# Patient Record
Sex: Female | Born: 1960 | Race: White | Hispanic: No | Marital: Married | Smoking: Never smoker
Health system: Southern US, Community
[De-identification: ages and names within clinical notes are randomized; demographics above are authoritative.]

## PROBLEM LIST (undated history)

## (undated) DIAGNOSIS — K219 Gastro-esophageal reflux disease without esophagitis: Secondary | ICD-10-CM

## (undated) DIAGNOSIS — N200 Calculus of kidney: Secondary | ICD-10-CM

## (undated) DIAGNOSIS — E785 Hyperlipidemia, unspecified: Secondary | ICD-10-CM

## (undated) DIAGNOSIS — H9190 Unspecified hearing loss, unspecified ear: Secondary | ICD-10-CM

## (undated) DIAGNOSIS — K589 Irritable bowel syndrome without diarrhea: Secondary | ICD-10-CM

## (undated) DIAGNOSIS — I10 Essential (primary) hypertension: Secondary | ICD-10-CM

## (undated) DIAGNOSIS — IMO0001 Reserved for inherently not codable concepts without codable children: Secondary | ICD-10-CM

## (undated) HISTORY — PX: CHOLECYSTECTOMY: SHX55

## (undated) HISTORY — PX: ABDOMINAL HYSTERECTOMY: SHX81

## (undated) HISTORY — PX: COCHLEAR IMPLANT: SUR684

---

## 2011-12-31 DIAGNOSIS — G43909 Migraine, unspecified, not intractable, without status migrainosus: Secondary | ICD-10-CM | POA: Insufficient documentation

## 2012-08-01 DIAGNOSIS — K589 Irritable bowel syndrome without diarrhea: Secondary | ICD-10-CM | POA: Insufficient documentation

## 2012-08-01 DIAGNOSIS — K219 Gastro-esophageal reflux disease without esophagitis: Secondary | ICD-10-CM | POA: Insufficient documentation

## 2013-02-01 DIAGNOSIS — I10 Essential (primary) hypertension: Secondary | ICD-10-CM | POA: Insufficient documentation

## 2013-02-07 ENCOUNTER — Encounter (HOSPITAL_BASED_OUTPATIENT_CLINIC_OR_DEPARTMENT_OTHER): Payer: Self-pay | Admitting: Emergency Medicine

## 2013-02-07 ENCOUNTER — Emergency Department (HOSPITAL_BASED_OUTPATIENT_CLINIC_OR_DEPARTMENT_OTHER): Payer: TRICARE For Life (TFL)

## 2013-02-07 ENCOUNTER — Emergency Department (HOSPITAL_BASED_OUTPATIENT_CLINIC_OR_DEPARTMENT_OTHER)
Admission: EM | Admit: 2013-02-07 | Discharge: 2013-02-07 | Disposition: A | Discharge status: Home / Self Care | Payer: TRICARE For Life (TFL) | Attending: Emergency Medicine | Admitting: Emergency Medicine

## 2013-02-07 DIAGNOSIS — Z8669 Personal history of other diseases of the nervous system and sense organs: Secondary | ICD-10-CM | POA: Insufficient documentation

## 2013-02-07 DIAGNOSIS — K219 Gastro-esophageal reflux disease without esophagitis: Secondary | ICD-10-CM | POA: Insufficient documentation

## 2013-02-07 DIAGNOSIS — R42 Dizziness and giddiness: Secondary | ICD-10-CM | POA: Insufficient documentation

## 2013-02-07 DIAGNOSIS — Z79899 Other long term (current) drug therapy: Secondary | ICD-10-CM | POA: Insufficient documentation

## 2013-02-07 DIAGNOSIS — R110 Nausea: Secondary | ICD-10-CM | POA: Insufficient documentation

## 2013-02-07 DIAGNOSIS — N200 Calculus of kidney: Secondary | ICD-10-CM

## 2013-02-07 HISTORY — DX: Gastro-esophageal reflux disease without esophagitis: K21.9

## 2013-02-07 HISTORY — DX: Reserved for inherently not codable concepts without codable children: IMO0001

## 2013-02-07 HISTORY — DX: Unspecified hearing loss, unspecified ear: H91.90

## 2013-02-07 HISTORY — DX: Calculus of kidney: N20.0

## 2013-02-07 LAB — URINE MICROSCOPIC-ADD ON

## 2013-02-07 LAB — URINALYSIS, ROUTINE W REFLEX MICROSCOPIC
Nitrite: NEGATIVE
Protein, ur: 30 mg/dL — AB
Specific Gravity, Urine: 1.025 (ref 1.005–1.030)
Urobilinogen, UA: 1 mg/dL (ref 0.0–1.0)

## 2013-02-07 LAB — BASIC METABOLIC PANEL
CO2: 29 mEq/L (ref 19–32)
Calcium: 9.7 mg/dL (ref 8.4–10.5)
GFR calc Af Amer: >90 mL/min (ref >90)
GFR calc non Af Amer: 82 mL/min — ABNORMAL LOW (ref >90)
Glucose, Bld: 106 mg/dL — ABNORMAL HIGH (ref 70–99)
Potassium: 3.9 mEq/L (ref 3.5–5.1)
Sodium: 139 mEq/L (ref 135–145)

## 2013-02-07 MED ORDER — OXYCODONE-ACETAMINOPHEN 5-325 MG PO TABS: 2.0000 | ORAL_TABLET | ORAL | Status: DC | PRN

## 2013-02-07 MED ORDER — ONDANSETRON HCL 4 MG/2ML IJ SOLN
4.0000 mg | Freq: Once | INTRAMUSCULAR | Status: AC
  Administered 2013-02-07: 4 mg via INTRAVENOUS

## 2013-02-07 MED ORDER — MORPHINE SULFATE 4 MG/ML IJ SOLN
4.0000 mg | Freq: Once | INTRAMUSCULAR | Status: AC
  Administered 2013-02-07: 4 mg via INTRAVENOUS
  Filled 2013-02-07: qty 1

## 2013-02-07 MED ORDER — TAMSULOSIN HCL 0.4 MG PO CAPS: 0.4000 mg | ORAL_CAPSULE | Freq: Every day | ORAL | Status: DC

## 2013-02-07 MED ORDER — ONDANSETRON 8 MG PO TBDP: ORAL_TABLET | ORAL | Status: DC

## 2013-02-07 MED ORDER — KETOROLAC TROMETHAMINE 30 MG/ML IJ SOLN
30.0000 mg | Freq: Once | INTRAMUSCULAR | Status: AC
  Administered 2013-02-07: 30 mg via INTRAVENOUS
  Filled 2013-02-07: qty 1

## 2013-02-07 NOTE — ED Notes (Signed)
Patient asked to change into gown. 

## 2013-02-07 NOTE — ED Notes (Signed)
Pt reports left flank pain that started 1 hour ago.  She has a known renal stone.  States she is able to void.

## 2013-02-07 NOTE — ED Notes (Signed)
Family at bedside. 

## 2013-02-07 NOTE — ED Notes (Signed)
States she was dx with kidney stone recently-left flank pain x 1 hour

## 2013-02-07 NOTE — ED Provider Notes (Signed)
CSN: [id number]     Arrival date & time 02/07/13  1325 History   First MD Initiated Contact with Patient 02/07/13 1336     Chief Complaint  Patient presents with  . Flank Pain   (Consider location/radiation/quality/duration/timing/severity/associated sxs/prior Treatment) HPI Comments: Patient presents with left flank pain. She states she was diagnosed earlier this summer with a stone in her kidney but about an hour ago started having sudden onset of pain in her left flank. It radiates around to her left midabdomen. She's never had pain like this before. She has some associated nausea and lightheadedness. She denies any urinary symptoms. She denies any fevers or chills. The kidney stone was picked up as an incidental finding on a CT scan of her abdomen for unrelated reasons.  Patient is a 52 y.o. female presenting with flank pain.  Flank Pain Pertinent negatives include no chest pain, no abdominal pain, no headaches and no shortness of breath.    Past Medical History  Diagnosis Date  . Kidney stone   . Hearing impaired   . GERD (gastroesophageal reflux disease)    Past Surgical History  Procedure Laterality Date  . Abdominal hysterectomy     No family history on file. History  Substance Use Topics  . Smoking status: Never Smoker   . Smokeless tobacco: Not on file  . Alcohol Use: No   OB History   Grav Para Term Preterm Abortions TAB SAB Ect Mult Living                 Review of Systems  Constitutional: Negative for fever, chills, diaphoresis and fatigue.  HENT: Negative for congestion, rhinorrhea and sneezing.   Eyes: Negative.   Respiratory: Negative for cough, chest tightness and shortness of breath.   Cardiovascular: Negative for chest pain and leg swelling.  Gastrointestinal: Positive for nausea. Negative for vomiting, abdominal pain, diarrhea and blood in stool.  Genitourinary: Positive for flank pain. Negative for frequency, hematuria and difficulty urinating.   Musculoskeletal: Positive for back pain. Negative for arthralgias.  Skin: Negative for rash.  Neurological: Negative for dizziness, speech difficulty, weakness, numbness and headaches.    Allergies  Aspirin  Home Medications   Current Outpatient Rx  Name  Route  Sig  Dispense  Refill  . OMEPRAZOLE PO   Oral   Take by mouth.         . ondansetron (ZOFRAN ODT) 8 MG disintegrating tablet      8mg  ODT q4 hours prn nausea   15 tablet   0   . oxyCODONE-acetaminophen (PERCOCET) 5-325 MG per tablet   Oral   Take 2 tablets by mouth every 4 (four) hours as needed.   20 tablet   0   . tamsulosin (FLOMAX) 0.4 MG CAPS capsule   Oral   Take 1 capsule (0.4 mg total) by mouth daily.   10 capsule   0    BP 138/80  Pulse 70  Temp(Src) 97.9 F (36.6 C) (Oral)  Resp 18  Ht 4\' 11"  (1.499 m)  Wt 95 lb (43.092 kg)  BMI 19.18 kg/m2  SpO2 100% Physical Exam  Constitutional: She is oriented to person, place, and time. She appears well-developed and well-nourished.  Patient appears uncomfortable  HENT:  Head: Normocephalic and atraumatic.  Eyes: Pupils are equal, round, and reactive to light.  Neck: Normal range of motion. Neck supple.  Cardiovascular: Normal rate, regular rhythm and normal heart sounds.   Pulmonary/Chest: Effort normal and breath sounds  normal. No respiratory distress. She has no wheezes. She has no rales. She exhibits no tenderness.  Abdominal: Soft. Bowel sounds are normal. There is tenderness (moderate tenderness to the left flank). There is no rebound and no guarding.  Musculoskeletal: Normal range of motion. She exhibits no edema.  Lymphadenopathy:    She has no cervical adenopathy.  Neurological: She is alert and oriented to person, place, and time.  Skin: Skin is warm and dry. No rash noted.  Psychiatric: She has a normal mood and affect.    ED Course  Procedures (including critical care time) Labs Review Results for orders placed during the hospital  encounter of 02/07/13  URINALYSIS, ROUTINE W REFLEX MICROSCOPIC      Result Value Range   Color, Urine YELLOW  YELLOW   APPearance CLEAR  CLEAR   Specific Gravity, Urine 1.025  1.005 - 1.030   pH 6.0  5.0 - 8.0   Glucose, UA NEGATIVE  NEGATIVE mg/dL   Hgb urine dipstick MODERATE (*) NEGATIVE   Bilirubin Urine NEGATIVE  NEGATIVE   Ketones, ur NEGATIVE  NEGATIVE mg/dL   Protein, ur 30 (*) NEGATIVE mg/dL   Urobilinogen, UA 1.0  0.0 - 1.0 mg/dL   Nitrite NEGATIVE  NEGATIVE   Leukocytes, UA SMALL (*) NEGATIVE  BASIC METABOLIC PANEL      Result Value Range   Sodium 139  135 - 145 mEq/L   Potassium 3.9  3.5 - 5.1 mEq/L   Chloride 99  96 - 112 mEq/L   CO2 29  19 - 32 mEq/L   Glucose, Bld 106 (*) 70 - 99 mg/dL   BUN 17  6 - 23 mg/dL   Creatinine, Ser 3.24  0.50 - 1.10 mg/dL   Calcium 9.7  8.4 - 40.1 mg/dL   GFR calc non Af Amer 82 (*) >90 mL/min   GFR calc Af Amer >90  >90 mL/min  URINE MICROSCOPIC-ADD ON      Result Value Range   Squamous Epithelial / LPF RARE  RARE   WBC, UA 3-6  <3 WBC/hpf   RBC / HPF 3-6  <3 RBC/hpf   Bacteria, UA MANY (*) RARE   Crystals CA OXALATE CRYSTALS (*) NEGATIVE   Ct Abdomen Pelvis Wo Contrast  02/07/2013   CLINICAL DATA:  Left flank pain.  Post hysterectomy.  EXAM: CT ABDOMEN AND PELVIS WITHOUT CONTRAST  TECHNIQUE: Multidetector CT imaging of the abdomen and pelvis was performed following the standard protocol without intravenous contrast.  COMPARISON:  03/16/2012  FINDINGS: Lung bases are clear. No effusions. Heart is normal size.  Prior cholecystectomy. Liver, spleen, pancreas, adrenals and right kidney are unremarkable on this unenhanced study. There is mild left hydronephrosis and hydroureter due to a 5 mm stone at the pelvic brim within the mid left ureter. Multiple calcified phleboliths in the pelvis.  Prior hysterectomy. No adnexal masses. Urinary bladder is unremarkable. Bowel grossly unremarkable. No free fluid, free air, or adenopathy. Aorta is  normal caliber.  IMPRESSION: 5 mm mid left ureteral stone with mild left hydronephrosis.   Electronically Signed   By: Charlett Nose M.D.   On: 02/07/2013 14:38     Imaging Review Ct Abdomen Pelvis Wo Contrast  02/07/2013   CLINICAL DATA:  Left flank pain.  Post hysterectomy.  EXAM: CT ABDOMEN AND PELVIS WITHOUT CONTRAST  TECHNIQUE: Multidetector CT imaging of the abdomen and pelvis was performed following the standard protocol without intravenous contrast.  COMPARISON:  03/16/2012  FINDINGS: Lung bases  are clear. No effusions. Heart is normal size.  Prior cholecystectomy. Liver, spleen, pancreas, adrenals and right kidney are unremarkable on this unenhanced study. There is mild left hydronephrosis and hydroureter due to a 5 mm stone at the pelvic brim within the mid left ureter. Multiple calcified phleboliths in the pelvis.  Prior hysterectomy. No adnexal masses. Urinary bladder is unremarkable. Bowel grossly unremarkable. No free fluid, free air, or adenopathy. Aorta is normal caliber.  IMPRESSION: 5 mm mid left ureteral stone with mild left hydronephrosis.   Electronically Signed   By: Charlett Nose M.D.   On: 02/07/2013 14:38    EKG Interpretation   None       MDM   1. Kidney stone    Patient has pain controlled after morphine, Toradol and Zofran. Her urine does not appear to be infected. She has no evidence of renal failure. She was given prescriptions for Percocet, Zofran and Flomax. She was advised to followup with a urologist as soon as possible. She lives in [facility] and requested a followup with a Nurse, learning disability. I did advise her that our urologists on call are with [place] but I did give her the numbers of both practices for potential followup. She was discharged with copies of her imaging studies.    Rolan Bucco, MD 02/07/13 [phone]

## 2013-02-08 LAB — URINE CULTURE

## 2013-02-14 DIAGNOSIS — N201 Calculus of ureter: Secondary | ICD-10-CM | POA: Insufficient documentation

## 2013-03-07 DIAGNOSIS — E785 Hyperlipidemia, unspecified: Secondary | ICD-10-CM | POA: Insufficient documentation

## 2013-11-02 DIAGNOSIS — N200 Calculus of kidney: Secondary | ICD-10-CM | POA: Insufficient documentation

## 2014-02-21 DIAGNOSIS — M810 Age-related osteoporosis without current pathological fracture: Secondary | ICD-10-CM | POA: Insufficient documentation

## 2014-06-28 DIAGNOSIS — H903 Sensorineural hearing loss, bilateral: Secondary | ICD-10-CM | POA: Insufficient documentation

## 2016-01-19 ENCOUNTER — Emergency Department (HOSPITAL_BASED_OUTPATIENT_CLINIC_OR_DEPARTMENT_OTHER)
Admission: EM | Admit: 2016-01-19 | Discharge: 2016-01-19 | Disposition: A | Discharge status: Home / Self Care | Payer: TRICARE For Life (TFL) | Attending: Emergency Medicine | Admitting: Emergency Medicine

## 2016-01-19 ENCOUNTER — Encounter (HOSPITAL_BASED_OUTPATIENT_CLINIC_OR_DEPARTMENT_OTHER): Payer: Self-pay | Admitting: *Deleted

## 2016-01-19 DIAGNOSIS — Z79899 Other long term (current) drug therapy: Secondary | ICD-10-CM | POA: Insufficient documentation

## 2016-01-19 DIAGNOSIS — N3000 Acute cystitis without hematuria: Secondary | ICD-10-CM

## 2016-01-19 DIAGNOSIS — R300 Dysuria: Secondary | ICD-10-CM | POA: Diagnosis present

## 2016-01-19 LAB — URINE MICROSCOPIC-ADD ON

## 2016-01-19 LAB — URINALYSIS, ROUTINE W REFLEX MICROSCOPIC
Bilirubin Urine: NEGATIVE
Glucose, UA: NEGATIVE mg/dL
KETONES UR: NEGATIVE mg/dL
Nitrite: NEGATIVE
PH: 7 (ref 5.0–8.0)
Protein, ur: NEGATIVE mg/dL
SPECIFIC GRAVITY, URINE: 1.003 — AB (ref 1.005–1.030)

## 2016-01-19 MED ORDER — HYDROCODONE-ACETAMINOPHEN 5-325 MG PO TABS: 1.0000 | ORAL_TABLET | ORAL | 0 refills | Status: DC | PRN

## 2016-01-19 MED ORDER — HYDROCODONE-ACETAMINOPHEN 5-325 MG PO TABS
1.0000 | ORAL_TABLET | Freq: Once | ORAL | Status: AC
  Administered 2016-01-19: 1 via ORAL
  Filled 2016-01-19: qty 1

## 2016-01-19 MED ORDER — PHENAZOPYRIDINE HCL 100 MG PO TABS
200.0000 mg | ORAL_TABLET | Freq: Once | ORAL | Status: AC
  Administered 2016-01-19: 200 mg via ORAL
  Filled 2016-01-19: qty 2

## 2016-01-19 MED ORDER — SULFAMETHOXAZOLE-TRIMETHOPRIM 800-160 MG PO TABS
1.0000 | ORAL_TABLET | Freq: Once | ORAL | Status: AC
  Administered 2016-01-19: 1 via ORAL
  Filled 2016-01-19: qty 1

## 2016-01-19 MED ORDER — PHENAZOPYRIDINE HCL 200 MG PO TABS
200.0000 mg | ORAL_TABLET | Freq: Three times a day (TID) | ORAL | 0 refills | Status: DC
Start: 2016-01-19 — End: 2018-02-01

## 2016-01-19 MED ORDER — SULFAMETHOXAZOLE-TRIMETHOPRIM 800-160 MG PO TABS: 1.0000 | ORAL_TABLET | Freq: Two times a day (BID) | ORAL | 0 refills | Status: AC

## 2016-01-19 NOTE — ED Provider Notes (Signed)
[facility] Provider Note   CSN: [id number] Arrival date & time: 01/19/16  [id number]     History   Chief Complaint Chief Complaint  Patient presents with  . Dysuria    HPI Joan Horn is a 55 y.o. female.  Pt presents to the ED today with dysuria.  She said that it feels like an UTI.  She denies f/c.      Past Medical History:  Diagnosis Date  . GERD (gastroesophageal reflux disease)   . Hearing impaired   . Kidney stone     There are no active problems to display for this patient.   Past Surgical History:  Procedure Laterality Date  . ABDOMINAL HYSTERECTOMY      OB History    No data available       Home Medications    Prior to Admission medications   Medication Sig Start Date End Date Taking? Authorizing Provider  amLODipine (NORVASC) 2.5 MG tablet Take 2.5 mg by mouth daily.   Yes Historical Provider, MD  HYDROcodone-acetaminophen (NORCO/VICODIN) 5-325 MG tablet Take 1 tablet by mouth every 4 (four) hours as needed. 01/19/16   Jacalyn LefevreJulie Shanise Balch, MD  OMEPRAZOLE PO Take by mouth.    Historical Provider, MD  ondansetron (ZOFRAN ODT) 8 MG disintegrating tablet 8mg  ODT q4 hours prn nausea 02/07/13   Rolan BuccoMelanie Belfi, MD  phenazopyridine (PYRIDIUM) 200 MG tablet Take 1 tablet (200 mg total) by mouth 3 (three) times daily. 01/19/16   Jacalyn LefevreJulie Tikesha Mort, MD  sulfamethoxazole-trimethoprim (BACTRIM DS,SEPTRA DS) 800-160 MG tablet Take 1 tablet by mouth 2 (two) times daily. 01/19/16 01/26/16  Jacalyn LefevreJulie Dontrey Snellgrove, MD    Family History No family history on file.  Social History Social History  Substance Use Topics  . Smoking status: Never Smoker  . Smokeless tobacco: Not on file  . Alcohol use No     Allergies   Aspirin   Review of Systems Review of Systems  Genitourinary: Positive for dysuria and hematuria.  All other systems reviewed and are negative.    Physical Exam Updated Vital Signs BP 163/88   Pulse 82   Temp 97.7 F (36.5 C) (Oral)   Resp  16   Ht 4\' 11"  (1.499 m)   Wt 98 lb (44.5 kg)   SpO2 100%   BMI 19.79 kg/m   Physical Exam  Constitutional: She is oriented to person, place, and time. She appears well-developed and well-nourished.  HENT:  Head: Normocephalic and atraumatic.  Right Ear: External ear normal.  Left Ear: External ear normal.  Nose: Nose normal.  Mouth/Throat: Oropharynx is clear and moist.  Eyes: Conjunctivae and EOM are normal. Pupils are equal, round, and reactive to light.  Neck: Normal range of motion. Neck supple.  Cardiovascular: Normal rate, regular rhythm, normal heart sounds and intact distal pulses.   Pulmonary/Chest: Effort normal and breath sounds normal.  Abdominal: Soft. Bowel sounds are normal. There is tenderness in the suprapubic area.  Musculoskeletal: Normal range of motion.  Neurological: She is alert and oriented to person, place, and time.  Skin: Skin is warm.  Psychiatric: She has a normal mood and affect. Her behavior is normal. Judgment and thought content normal.  Nursing note and vitals reviewed.    ED Treatments / Results  Labs (all labs ordered are listed, but only abnormal results are displayed) Labs Reviewed  URINALYSIS, ROUTINE W REFLEX MICROSCOPIC (NOT AT [facility]) - Abnormal; Notable for the following:       Result Value  Specific Gravity, Urine 1.003 (*)    Hgb urine dipstick TRACE (*)    Leukocytes, UA SMALL (*)    All other components within normal limits  URINE MICROSCOPIC-ADD ON - Abnormal; Notable for the following:    Squamous Epithelial / LPF 0-5 (*)    Bacteria, UA FEW (*)    All other components within normal limits    EKG  EKG Interpretation None       Radiology No results found.  Procedures Procedures (including critical care time)  Medications Ordered in ED Medications  sulfamethoxazole-trimethoprim (BACTRIM DS,SEPTRA DS) 800-160 MG per tablet 1 tablet (not administered)  phenazopyridine (PYRIDIUM) tablet 200 mg (not administered)    HYDROcodone-acetaminophen (NORCO/VICODIN) 5-325 MG per tablet 1 tablet (not administered)     Initial Impression / Assessment and Plan / ED Course  I have reviewed the triage vital signs and the nursing notes.  Pertinent labs & imaging results that were available during my care of the patient were reviewed by me and considered in my medical decision making (see chart for details).  Clinical Course     UA does not look too bad, but she has dysuria and is symptomatic, so I will treat her with bactrim and pyridium.  She knows to return if worse.   Final Clinical Impressions(s) / ED Diagnoses   Final diagnoses:  Acute cystitis without hematuria    New Prescriptions New Prescriptions   HYDROCODONE-ACETAMINOPHEN (NORCO/VICODIN) 5-325 MG TABLET    Take 1 tablet by mouth every 4 (four) hours as needed.   PHENAZOPYRIDINE (PYRIDIUM) 200 MG TABLET    Take 1 tablet (200 mg total) by mouth 3 (three) times daily.   SULFAMETHOXAZOLE-TRIMETHOPRIM (BACTRIM DS,SEPTRA DS) 800-160 MG TABLET    Take 1 tablet by mouth 2 (two) times daily.     Jacalyn LefevreJulie Rhina Kramme, MD 01/19/16 [phone]

## 2016-01-19 NOTE — ED Triage Notes (Signed)
Pt c/o painful freq urination x 2 days 

## 2018-02-01 ENCOUNTER — Ambulatory Visit (INDEPENDENT_AMBULATORY_CARE_PROVIDER_SITE_OTHER): Admitting: Family Medicine

## 2018-02-01 ENCOUNTER — Ambulatory Visit (HOSPITAL_BASED_OUTPATIENT_CLINIC_OR_DEPARTMENT_OTHER)
Admission: RE | Admit: 2018-02-01 | Discharge: 2018-02-01 | Disposition: A | Discharge status: Home / Self Care | Source: Ambulatory Visit | Attending: Family Medicine | Admitting: Family Medicine

## 2018-02-01 ENCOUNTER — Encounter: Payer: Self-pay | Admitting: Family Medicine

## 2018-02-01 VITALS — BP 128/80 | HR 66 | Ht <= 58 in | Wt 107.0 lb

## 2018-02-01 DIAGNOSIS — S82001A Unspecified fracture of right patella, initial encounter for closed fracture: Secondary | ICD-10-CM

## 2018-02-01 MED ORDER — HYDROCODONE-ACETAMINOPHEN 5-325 MG PO TABS: 1.0000 | ORAL_TABLET | Freq: Four times a day (QID) | ORAL | 0 refills | Status: DC | PRN

## 2018-02-01 NOTE — Progress Notes (Signed)
   CC: r knee patellar fracture  HPI  R knee pain - report hx of fracture on 11/29, comes in today wearing brace. She was walking on a curb on black Friday and tripped striking directly on the R knee cap. [place] - XR with patelllar fracture. She was placed in a knee immobilizer and given norco. She has been doing ok with pain, but more pain with when she accidentally bears weight (has crutches) or with sleep. No hx of trauma or knee surgery in the past.  Pain level 5/10 currently, sharp anterior knee.  No skin changes, numbness.  CC, SH/smoking status, and VS noted  Objective: BP 128/80   Pulse 66   Ht 4\' 10"  (1.473 m)   Wt 107 lb (48.5 kg)   BMI 22.36 kg/m  Gen: NAD, alert, cooperative, and pleasant. Ext:  Right knee: No gross deformity, ecchymoses, swelling. TTP anterior patella, less medial joint line. Full extension - did not test flexion with known patellar fracture;  Strength not tested also. Negative valgus/varus testing. NV intact distally.  Left knee: No deformity. FROM with 5/5 strength. No tenderness to palpation. NVI distally.  Neuro: Alert and oriented, Speech clear, No gross deficits  Assessment and plan:  R patellar fracture: patient with known fracture, visualized outside XR on our computer - nondisplaced. Independently reviewed repeat radiographs from today and no change noted.  She comes in 20 degree flexion in bledsoe brace - able to change to 10 but did not tolerate complete extension.  Will reevaluate when 6 weeks out.  Icing, norco if needed.  Orders Placed This Encounter  Procedures  . DG Knee 4 Views W/Patella Right    Standing Status:   Future    Number of Occurrences:   1    Standing Expiration Date:   04/05/2019    Order Specific Question:   Reason for Exam (SYMPTOM  OR DIAGNOSIS REQUIRED)    Answer:   right patellar fracture x 3 weeks - reassess    Order Specific Question:   Is patient pregnant?    Answer:    No    Order Specific Question:   [facility]    [facility]    Order Specific Question:   Radiology Contrast Protocol - do NOT remove file path    Answer:   [email or web address]    Meds ordered this encounter  Medications  . HYDROcodone-acetaminophen (NORCO) 5-325 MG tablet    Sig: Take 1 tablet by mouth every 6 (six) hours as needed for moderate pain.    Dispense:  20 tablet    Refill:  0    Loni MuseKate , MD, PGY3 02/01/2018 11:05 AM

## 2018-02-01 NOTE — Patient Instructions (Addendum)
You have a patellar fracture. Wear immobilizer at all times except to wash area, ice this. When taking this off you have to keep the knee straight. Icing 15 minutes at a time 3-4 times a day. Elevate above your heart as needed for swelling. Tylenol and/or aleve as needed for pain. Don't take tylenol with the norco you have as this also has tylenol in it. Follow up with me on or around January 10th for reevaluation.

## 2018-02-25 ENCOUNTER — Ambulatory Visit (HOSPITAL_BASED_OUTPATIENT_CLINIC_OR_DEPARTMENT_OTHER)
Admission: RE | Admit: 2018-02-25 | Discharge: 2018-02-25 | Disposition: A | Discharge status: Home / Self Care | Source: Ambulatory Visit | Attending: Family Medicine | Admitting: Family Medicine

## 2018-02-25 ENCOUNTER — Encounter: Payer: Self-pay | Admitting: Family Medicine

## 2018-02-25 ENCOUNTER — Ambulatory Visit (INDEPENDENT_AMBULATORY_CARE_PROVIDER_SITE_OTHER): Admitting: Family Medicine

## 2018-02-25 VITALS — BP 150/90 | HR 67 | Ht <= 58 in | Wt 107.0 lb

## 2018-02-25 DIAGNOSIS — S8991XD Unspecified injury of right lower leg, subsequent encounter: Secondary | ICD-10-CM | POA: Diagnosis present

## 2018-02-25 MED ORDER — HYDROCODONE-ACETAMINOPHEN 5-325 MG PO TABS: 1.0000 | ORAL_TABLET | Freq: Four times a day (QID) | ORAL | 0 refills | Status: DC | PRN

## 2018-02-25 NOTE — Patient Instructions (Signed)
Your x-rays look great. Switch to a hinged knee brace now. Ice the knee as needed. Ibuprofen with the norco as needed for severe pain. Start physical therapy, do motion exercise I showed you too. Follow up with me in 4 weeks for reevaluation.

## 2018-02-26 ENCOUNTER — Encounter: Payer: Self-pay | Admitting: Family Medicine

## 2018-02-26 NOTE — Progress Notes (Signed)
   CC: r knee patellar fracture  HPI: 02/01/18: R knee pain - report hx of fracture on 11/29, comes in today wearing brace. She was walking on a curb on black Friday and tripped striking directly on the R knee cap. She was in [place] and went to the ED - XR with patelllar fracture. She was placed in a knee immobilizer and given norco. She has been doing ok with pain, but more pain with when she accidentally bears weight (has crutches) or with sleep. No hx of trauma or knee surgery in the past.  Pain level 5/10 currently, sharp anterior knee.  No skin changes, numbness.  02/25/18:  CC, SH/smoking status, and VS noted  Objective: BP (!) 150/90   Pulse 67   Ht 4\' 10"  (1.473 m)   Wt 107 lb (48.5 kg)   BMI 22.36 kg/m  Gen: NAD, comfortable in exam room  Right knee: No gross deformity, ecchymoses, swelling. Mild TTP anterior patella.  No other tenderness. Full extension - able to flex to 20 degrees only (tested after reviewing radiographs). Negative valgus/varus testing. NV intact distally.  MSK u/s:  No separation at small fracture site anterior patella with knee flexion.  Assessment and Plan: 1. Right patellar fracture - independently reviewed radiographs and excellent healing to date - can see anterior aspect of fracture line now only - ultrasound performed to make sure there was no distraction of superior and inferior aspects of patella.  Switched to hinged knee brace, start physical therapy and work on regaining motion.  Ibuprofen with norco and icing as needed.  F/u in 4 weeks.

## 2018-03-03 ENCOUNTER — Ambulatory Visit: Attending: Family Medicine | Admitting: Physical Therapy

## 2018-03-03 ENCOUNTER — Other Ambulatory Visit: Payer: Self-pay

## 2018-03-03 DIAGNOSIS — R2689 Other abnormalities of gait and mobility: Secondary | ICD-10-CM | POA: Diagnosis present

## 2018-03-03 DIAGNOSIS — M6281 Muscle weakness (generalized): Secondary | ICD-10-CM | POA: Insufficient documentation

## 2018-03-03 DIAGNOSIS — M25561 Pain in right knee: Secondary | ICD-10-CM | POA: Insufficient documentation

## 2018-03-03 DIAGNOSIS — R262 Difficulty in walking, not elsewhere classified: Secondary | ICD-10-CM | POA: Diagnosis present

## 2018-03-03 DIAGNOSIS — M25661 Stiffness of right knee, not elsewhere classified: Secondary | ICD-10-CM | POA: Diagnosis present

## 2018-03-03 NOTE — Therapy (Signed)
[facility] [address]  [address] [place], [place], [address] Phone: [phone]   Fax:  [phone]  Physical Therapy Evaluation  Patient Details  Name: Joan Horn MRN: [id number] Date of Birth: 01-Oct-1960 Referring Provider (PT): Norton Blizzard, MD   Encounter Date: 03/03/2018  PT End of Session - 03/03/18 1445    Visit Number  1    Number of Visits  16    Date for PT Re-Evaluation  04/14/18    Authorization Type  [employer] - PT only    PT Start Time  1445    PT Stop Time  1535    PT Time Calculation (min)  50 min    Activity Tolerance  Patient tolerated treatment well;Patient limited by pain    Behavior During Therapy  [facility] for tasks assessed/performed       Past Medical History:  Diagnosis Date  . GERD (gastroesophageal reflux disease)   . Hearing impaired   . Kidney stone     Past Surgical History:  Procedure Laterality Date  . ABDOMINAL HYSTERECTOMY      There were no vitals filed for this visit.   Subjective Assessment - 03/03/18 1450    Subjective  Pt reports she fell walking walking/shoping on Black friday landing hard on her R knee, suffering a patella fracture. No surgical intervention required, just immobilized NWB on crutches until now. As of MD visit on 02/25/18 switched to hinged knee brace and cleared to begin to work on ROM.    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    Patient Stated Goals  "to get off the crutches and walk"    Currently in Pain?  Yes    Pain Score  6     Pain Location  Knee    Pain Orientation  Right    Pain Descriptors / Indicators  Sharp    Pain Type  Acute pain    Pain Radiating Towards  down front of R leg to ankle    Pain Onset  More than a month ago    Pain Frequency  Intermittent    Aggravating  Factors   dependent position, weight bearing    Pain Relieving Factors  ice, elevation, pain meds    Effect of Pain on Daily Activities  curretly NWB on R, pain interferes with sleep, limited positional tolerance         [facility] PT Assessment - 03/03/18 1445      Assessment   Medical Diagnosis  R patellar fracture    Referring Provider (PT)  Norton Blizzard, MD    Onset Date/Surgical Date  01/14/18    Next MD Visit  03/25/18      Precautions   Precautions  Fall    Required Braces or Orthoses  Other Brace/Splint    Other Brace/Splint  hinged R knee brace      Restrictions   Weight Bearing Restrictions  Yes    RLE Weight Bearing  Weight bearing as tolerated   arrived NWB R - MD clarifcation received to progress to WBAT     Balance Screen   Has the patient fallen in the past 6 months  Yes    How many times?  1    Has the patient had a decrease in activity level because of a fear of  falling?   Yes    Is the patient reluctant to leave their home because of a fear of falling?   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    Leisure  Walk 3 miles up to 3x/day, hiking      Observation/Other Assessments   Focus on Therapeutic Outcomes (FOTO)   Knee - 15% (85% limitation); Predicted 49% (51% limitation)      ROM / Strength   AROM / PROM / Strength  AROM;PROM;Strength      AROM   AROM Assessment Site  Knee    Right/Left Knee  Right;Left    Right Knee Extension  12    Right Knee Flexion  50    Left Knee Extension  -1    Left Knee  Flexion  150      PROM   PROM Assessment Site  Knee    Right/Left Knee  Right    Right Knee Extension  12    Right Knee Flexion  64      Strength   Overall Strength  Deficits;Due to pain    Overall Strength Comments  testing completed in supine d/t lilmited positional tolerance secondary to pain    Strength Assessment Site  Hip;Knee    Right/Left Hip  Right;Left    Right Hip Flexion  3+/5    Right Hip Extension  3+/5    Right Hip ABduction  4-/5    Right Hip ADduction  4-/5    Left Hip Flexion  4/5    Left Hip Extension  4/5    Left Hip ABduction  4+/5    Left Hip ADduction  4+/5    Right/Left Knee  Right;Left    Right Knee Flexion  2-/5    Right Knee Extension  2-/5    Left Knee Flexion  5/5    Left Knee Extension  5/5      Ambulation/Gait   Ambulation/Gait  Yes    Ambulation/Gait Assistance  6: Modified independent (Device/Increase time)    Ambulation Distance (Feet)  80 Feet    Assistive device  Crutches    Gait Pattern  Step-to pattern;Right flexed knee in stance   NWB R   Gait Comments  MD clearance to progress to WBAT received after completion of eval.                Objective measurements completed on examination: See above findings.      [facility] Adult PT Treatment/Exercise - 03/03/18 1445      Exercises   Exercises  Knee/Hip      Knee/Hip Exercises: Seated   Heel Slides Limitations  demonstrated by PT, but not attempted by patient      Knee/Hip Exercises: Supine   Quad Sets  Right;5 reps   5 sec hold   Quad Sets Limitations  into rolled towel    Heel Slides  Right;AAROM;5 reps    Heel Slides Limitations  with towel assist    Straight Leg Raises  Right;5 reps;AROM    Straight Leg Raises Limitations  cues for quad set prior to initiation of lift    Other Supine Knee/Hip Exercises  R hip ABD/ADD x 5      Modalities   Modalities  Cryotherapy      Cryotherapy   Number Minutes Cryotherapy  10 Minutes    Cryotherapy Location  Knee   Rt   Type  of Cryotherapy  Ice pack   with elevation & knee positoned in as much extension as tol            PT Education - 03/03/18 1535    Education Details  PT eval findings, anticipated POC, initial HEP and education on proper positioning to promote knee extension & avoid flexion contracture    Person(s) Educated  Patient    Methods  Explanation;Demonstration;Handout    Comprehension  Verbalized understanding;Returned demonstration;Need further instruction       PT Short Term Goals - 03/03/18 1535      PT SHORT TERM GOAL #1   Title  Independent with initial HEP    Status  New    Target Date  03/24/18      PT SHORT TERM GOAL #2   Title  Patient will ambuate WBAT on R in hinged brace with single axillary crutch or LRAD     Status  New    Target Date  03/31/18      PT SHORT TERM GOAL #3   Title  R knee AROM >/= 5-90 degrees    Status  New    Target Date  03/31/18        PT Long Term Goals - 03/03/18 1535      PT LONG TERM GOAL #1   Title  Independent with ongoing/advanced HEP    Status  New    Target Date  04/28/18      PT LONG TERM GOAL #2   Title  R knee AROM >/= 0-130 dg to allow for normal gait and stair negotiation    Status  New    Target Date  04/28/18      PT LONG TERM GOAL #3   Title  R hip and knee strength >/= 4+/5 for improved stability    Status  New    Target Date  04/28/18      PT LONG TERM GOAL #4   Title  Patient will ambuate with normal gait pattern w/o AD    Status  New    Target Date  04/28/18      PT LONG TERM GOAL #5   Title  Patient will report ability to complete ADLs and light household chores w/o restriction due to R knee pain, LOM or weakness    Status  New    Target Date  04/28/18             Plan - 03/03/18 1535    Clinical Impression Statement  Joan Horn is a 58 y/o female who presents to OP PT almost 7 weeks s/p R patellar fracture from a fall on 01/14/18. Recent radiographs demonstrate good fracture healing. Pt recently  transitioned from knee immobilizer to hinged knee brace as of MD appt on 02/25/18 and cleared to start working on ROM. Pt arrives to PT NWB on R using B axillary crutches with knee held in sustained flexion at ~20 degrees. (Clearance received from MD  after visit completed to progress gait to [facility], therefore will provide instruction for this on next visit.) R knee ROM severely limited with AROM 12-50 and PROM 12-64 with pt demonstrating significant guarding due to pain. Strength assessment limited by positional tolerance but weakness evident in B hips and R knee. Joan Horn will benefit from skilled PT to restore functional ROM and strength in R LE as well as normal gait pattern w/o need for AD to allow her to return to work and prior active lifestyle.    Clinical Presentation  Stable    Clinical Decision Making  Low    Rehab Potential  Good    Clinical Impairments Affecting Rehab Potential  B sensorineural hearing loss - cochlear implant & pt reads lips    PT Frequency  2x / week    PT Duration  8 weeks    PT Treatment/Interventions  Patient/family education;Therapeutic exercise;Therapeutic activities;Functional mobility training;Gait training;Stair training;DME Instruction;Neuromuscular re-education;Balance training;Manual techniques;Passive range of motion;Taping;Dry needling;Cryotherapy;Vasopneumatic Device;Moist Heat;Electrical Stimulation;Iontophoresis 4mg /ml Dexamethasone;ADLs/Self Care Home Management    PT Next Visit Plan  Review initial HEP; R knee ROM & LE flexibility; Instruction in gait progression to WBAT R with crutches    Consulted and Agree with Plan of Care  Patient       Patient will benefit from skilled therapeutic intervention in order to improve the following deficits and impairments:  Pain, Decreased range of motion, Impaired flexibility, Increased muscle spasms, Decreased strength, Difficulty walking, Abnormal gait, Decreased activity tolerance, Decreased balance, Decreased endurance,  Postural dysfunction, Improper body mechanics  Visit Diagnosis: Acute pain of right knee  Stiffness of right knee, not elsewhere classified  Muscle weakness (generalized)  Other abnormalities of gait and mobility  Difficulty in walking, not elsewhere classified     Problem List Patient Active Problem List   Diagnosis Date Noted  . Sensorineural hearing loss, bilateral 06/28/2014  . Osteoporosis 02/21/2014  . Nephrolithiasis 11/02/2013  . Hyperlipidemia 03/07/2013  . Calculus of ureter 02/14/2013  . Essential hypertension 02/01/2013  . Gastro-esophageal reflux disease without esophagitis 08/01/2012  . Irritable bowel syndrome without diarrhea 08/01/2012  . Migraine, unspecified, not intractable, without status migrainosus 12/31/2011    Marry Guan, PT, MPT 03/03/2018, 7:43 PM  [facility] [address]  [address] [place], [place], [address] Phone: [phone]   Fax:  [phone]  Name: Joan Horn MRN: [id number] Date of Birth: 10/23/1960

## 2018-03-08 ENCOUNTER — Encounter: Payer: Self-pay | Admitting: Physical Therapy

## 2018-03-08 ENCOUNTER — Ambulatory Visit: Admitting: Physical Therapy

## 2018-03-08 DIAGNOSIS — M25561 Pain in right knee: Secondary | ICD-10-CM | POA: Diagnosis not present

## 2018-03-08 DIAGNOSIS — M6281 Muscle weakness (generalized): Secondary | ICD-10-CM

## 2018-03-08 DIAGNOSIS — R262 Difficulty in walking, not elsewhere classified: Secondary | ICD-10-CM

## 2018-03-08 DIAGNOSIS — R2689 Other abnormalities of gait and mobility: Secondary | ICD-10-CM

## 2018-03-08 DIAGNOSIS — M25661 Stiffness of right knee, not elsewhere classified: Secondary | ICD-10-CM

## 2018-03-08 NOTE — Therapy (Signed)
[facility] [address]  [place], [place], [address] Phone: [phone]   Fax:  [phone]  Physical Therapy Treatment  Patient Details  Name: Joan Horn MRN: [id number] Date of Birth: 1960/02/21 Referring Provider (PT): Karlton Lemon, MD   Encounter Date: 03/08/2018  PT End of Session - 03/08/18 1637    Visit Number  2    Number of Visits  16    Date for PT Re-Evaluation  04/14/18    Authorization Type  [employer] - PT only    PT Start Time  1439    PT Stop Time  1538    PT Time Calculation (min)  59 min    Activity Tolerance  Patient tolerated treatment well;Patient limited by pain    Behavior During Therapy  [facility] for tasks assessed/performed       Past Medical History:  Diagnosis Date  . GERD (gastroesophageal reflux disease)   . Hearing impaired   . Kidney stone     Past Surgical History:  Procedure Laterality Date  . ABDOMINAL HYSTERECTOMY      There were no vitals filed for this visit.  Subjective Assessment - 03/08/18 1438    Subjective  Patient using crutches into clinic today. Reports that she is not able to wear a shoe on R foot. Has been compliant with HEP.     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    Patient Stated Goals  "to get off the crutches and walk"    Currently in Pain?  Yes    Pain Score  5     Pain Location  Knee    Pain Orientation  Right;Medial    Pain Descriptors / Indicators  Sharp    Pain Type  Acute pain         [facility] PT Assessment - 03/08/18 0001      AROM   Right Knee Flexion  90   AAROM with strap during heel slide                  [facility] Adult PT Treatment/Exercise - 03/08/18 0001      Knee/Hip Exercises: Stretches   Passive Hamstring Stretch  Right;30 seconds;1 rep    Passive Hamstring Stretch Limitations  supine strap to relieve muscle cramp     Quad Stretch  Right;2 reps;30 seconds    Quad Stretch Limitations  prone with strap and thigh elevated on 1/2 bolster    Press photographer  Right;30 seconds;2 reps    Gastroc Stretch Limitations  long sitting with strap      Knee/Hip Exercises: Aerobic   Nustep  L1 x 6 min UE/LEs    limited ROM d/t pain     Knee/Hip Exercises: Standing   Heel Raises  Both;1 set;10 reps    Heel Raises Limitations  at counter top on foam    Other Standing Knee Exercises  R weight shifts on foam at counter top 10x5"   heavy cues for TKE     Knee/Hip Exercises: Supine   Quad Sets  Strengthening;Right;1 set;10 reps    Quad Sets Limitations  ankle elevated on bolster    Heel Slides  AAROM;Right;10 reps    Heel Slides Limitations  10x5" on orange pball with strap to tolerance    Straight Leg Raises  Right;10 reps;Strengthening    Straight Leg Raises Limitations  cues to stop at 45 deg and control lower    Other Supine Knee/Hip Exercises  R hip ABD/ADD x 10   c/o R patellar pain     Modalities   Modalities  Vasopneumatic      Vasopneumatic   Number Minutes Vasopneumatic   10 minutes    Vasopnuematic Location   Knee   R   Vasopneumatic Pressure  Low    Vasopneumatic Temperature   coldest      Manual Therapy   Manual Therapy  --             PT Education - 03/08/18 1637    Education Details  update to HEP; advised to start [place] with knee brace and shoe on R LE still with crutch use    Person(s) Educated  Patient    Methods  Explanation;Demonstration;Tactile cues;Verbal cues;Handout    Comprehension  Verbalized understanding;Returned demonstration       PT Short Term Goals - 03/08/18 1640      PT SHORT TERM GOAL #1   Title  Independent with initial HEP    Status  On-going      PT SHORT TERM GOAL #2   Title  Patient will ambuate WBAT on R in hinged brace with single axillary crutch or LRAD     Status  On-going      PT SHORT TERM GOAL #3   Title  R knee AROM >/= 5-90 degrees     Status  Partially Met        PT Long Term Goals - 03/08/18 1640      PT LONG TERM GOAL #1   Title  Independent with ongoing/advanced HEP    Status  On-going      PT LONG TERM GOAL #2   Title  R knee AROM >/= 0-130 dg to allow for normal gait and stair negotiation    Status  On-going      PT LONG TERM GOAL #3   Title  R hip and knee strength >/= 4+/5 for improved stability    Status  On-going      PT LONG TERM GOAL #4   Title  Patient will ambuate with normal gait pattern w/o AD    Status  On-going      PT LONG TERM GOAL #5   Title  Patient will report ability to complete ADLs and light household chores w/o restriction due to R knee pain, LOM or weakness    Status  On-going            Plan - 03/08/18 1638    Clinical Impression Statement  Patient ambulated into clinic [place] on R LE with 2 crutches and without shoe on R LE. Advised patient on clearance from MD to WBAT on R LE. Also advised to bring shoe to next appointment to progress into [place] activities. Patient reported understanding but advised that most of her shoes do not fit her R foot d/t edema. Patient required manual and verbal cues to encourage quad set, improved contraction and TKE by the last couple reps. Patient with c/o pain with all ther-ex today requiring quad contraction, citing R patellar pain but able to continue. Patient able to reach 90 degrees R knee flexion AAROM after heel slides. Reported muscle cramp which was relieved with gastroc stretch. Updated HEP to include this stretch as well as standing weight shifting and heel sides at counter top for safety. Ended session with [place] to R knee for pain relief.  No complaints at end of session.     Clinical Impairments Affecting Rehab Potential  B sensorineural hearing loss - cochlear implant & pt reads lips    PT Treatment/Interventions  Patient/family education;Therapeutic exercise;Therapeutic activities;Functional mobility training;Gait training;Stair  training;DME Instruction;Neuromuscular re-education;Balance training;Manual techniques;Passive range of motion;Taping;Dry needling;Cryotherapy;Vasopneumatic Device;Moist Heat;Electrical Stimulation;Iontophoresis 58m/ml Dexamethasone;ADLs/Self Care Home Management    PT Next Visit Plan  Instruction in gait progression to WBAT R with crutches    Consulted and Agree with Plan of Care  Patient       Patient will benefit from skilled therapeutic intervention in order to improve the following deficits and impairments:  Pain, Decreased range of motion, Impaired flexibility, Increased muscle spasms, Decreased strength, Difficulty walking, Abnormal gait, Decreased activity tolerance, Decreased balance, Decreased endurance, Postural dysfunction, Improper body mechanics  Visit Diagnosis: Acute pain of right knee  Stiffness of right knee, not elsewhere classified  Muscle weakness (generalized)  Other abnormalities of gait and mobility  Difficulty in walking, not elsewhere classified     Problem List Patient Active Problem List   Diagnosis Date Noted  . Sensorineural hearing loss, bilateral 06/28/2014  . Osteoporosis 02/21/2014  . Nephrolithiasis 11/02/2013  . Hyperlipidemia 03/07/2013  . Calculus of ureter 02/14/2013  . Essential hypertension 02/01/2013  . Gastro-esophageal reflux disease without esophagitis 08/01/2012  . Irritable bowel syndrome without diarrhea 08/01/2012  . Migraine, unspecified, not intractable, without status migrainosus 12/31/2011    YJanene Harvey PT, DPT 03/08/18 4:42 PM   [facility] [address] [place] [address]Phone: [phone]  Fax:  [phone] Name: Joan RicklefsMRN: [id number] of Birth: [birth year]

## 2018-03-11 ENCOUNTER — Ambulatory Visit: Admitting: Physical Therapy

## 2018-03-11 ENCOUNTER — Encounter: Payer: Self-pay | Admitting: Physical Therapy

## 2018-03-11 DIAGNOSIS — R2689 Other abnormalities of gait and mobility: Secondary | ICD-10-CM

## 2018-03-11 DIAGNOSIS — M6281 Muscle weakness (generalized): Secondary | ICD-10-CM

## 2018-03-11 DIAGNOSIS — M25561 Pain in right knee: Secondary | ICD-10-CM | POA: Diagnosis not present

## 2018-03-11 DIAGNOSIS — M25661 Stiffness of right knee, not elsewhere classified: Secondary | ICD-10-CM

## 2018-03-11 DIAGNOSIS — R262 Difficulty in walking, not elsewhere classified: Secondary | ICD-10-CM

## 2018-03-11 NOTE — Therapy (Signed)
[facility] [address]  [place], [place], [address] Phone: [phone]   Fax:  [phone]  Physical Therapy Treatment  Patient Details  Name: Joan Horn MRN: [id number] Date of Birth: [birth year] Referring Provider (PT): Karlton Lemon, MD   Encounter Date: 03/11/2018  PT End of Session - 03/11/18 6144    Visit Number  3    Number of Visits  16    Date for PT Re-Evaluation  04/14/18    Authorization Type  [employer] - PT only    PT Start Time  0937    PT Stop Time  1030    PT Time Calculation (min)  53 min    Activity Tolerance  Patient tolerated treatment well;Patient limited by pain    Behavior During Therapy  [facility] for tasks assessed/performed       Past Medical History:  Diagnosis Date  . GERD (gastroesophageal reflux disease)   . Hearing impaired   . Kidney stone     Past Surgical History:  Procedure Laterality Date  . ABDOMINAL HYSTERECTOMY      There were no vitals filed for this visit.  Subjective Assessment - 03/11/18 0939    Subjective  Pt reporting pain much improved - no pain on arrival to PT today.    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    Patient Stated Goals  "to get off the crutches and walk"    Currently in Pain?  No/denies    Pain Score  0-No pain                       [facility] Adult PT Treatment/Exercise - 03/11/18 0937      Ambulation/Gait   Ambulation/Gait Assistance  5: Supervision    Ambulation/Gait Assistance Details  Instruction in step-through pattern with crutches WBAT on R - cues for proper sequencing of crutches with R LE as well as increased step length.    Ambulation Distance (Feet)  110 Feet    Assistive device  Crutches    Gait Pattern  Step-to pattern;Step-through pattern;Right flexed knee in stance;Decreased weight shift to right;Decreased stance  time - right;Decreased stride length;Decreased step length - right;Decreased step length - left   WBAT R   Ambulation Surface  Level;Indoor      Exercises   Exercises  Knee/Hip      Knee/Hip Exercises: Stretches   Passive Hamstring Stretch  Right;30 seconds;2 reps    Passive Hamstring Stretch Limitations  + gastroc, supine with srtap    ITB Stretch  Right;30 seconds;2 reps    ITB Stretch Limitations  supine with strap      Knee/Hip Exercises: Aerobic   Recumbent Bike  partial revolutions/rocking for ROM x 6 min      Knee/Hip Exercises: Standing   Hip Flexion  Right;Both;10 reps;Knee bent;AROM   2 sets   Hip Flexion Limitations  1st set - R only; 2nd set - alt march    Terminal Knee Extension  Right;10 reps;AROM    Terminal Knee Extension Limitations  focusing on quat set & trying to raise toes; UEsupport on back of chair    Other Standing Knee Exercises  R/L weight shift x 10      Knee/Hip Exercises: Seated   Long Arc Quad  Right;10 reps;Weights;Strengthening    Long Arc Quad Weight  2 lbs.  Heel Slides  Right;15 reps;AROM    Heel Slides Limitations  foot resting on small ball - pause fro stretch at end range flexion & extension    Hamstring Curl  Right;10 reps;Strengthening    Hamstring Limitations  yellow TB      Knee/Hip Exercises: Supine   Patellar Mobs  R knee medial patellar glides      Modalities   Modalities  Cryotherapy      Cryotherapy   Number Minutes Cryotherapy  10 Minutes    Cryotherapy Location  Knee   Rt   Type of Cryotherapy  Ice pack               PT Short Term Goals - 03/08/18 1640      PT SHORT TERM GOAL #1   Title  Independent with initial HEP    Status  On-going      PT SHORT TERM GOAL #2   Title  Patient will ambuate WBAT on R in hinged brace with single axillary crutch or LRAD     Status  On-going      PT SHORT TERM GOAL #3   Title  R knee AROM >/= 5-90 degrees    Status  Partially Met        PT Long Term Goals -  03/08/18 1640      PT LONG TERM GOAL #1   Title  Independent with ongoing/advanced HEP    Status  On-going      PT LONG TERM GOAL #2   Title  R knee AROM >/= 0-130 dg to allow for normal gait and stair negotiation    Status  On-going      PT LONG TERM GOAL #3   Title  R hip and knee strength >/= 4+/5 for improved stability    Status  On-going      PT LONG TERM GOAL #4   Title  Patient will ambuate with normal gait pattern w/o AD    Status  On-going      PT LONG TERM GOAL #5   Title  Patient will report ability to complete ADLs and light household chores w/o restriction due to R knee pain, LOM or weakness    Status  On-going            Plan - 03/11/18 0941    Clinical Impression Statement  Aahana reporting R knee pain much improved but continues to avoid weight bearing on R, therefore provided instruction in gait WBAT on R - pt requiring extensive cues but able to learn appropriate pattern and instructed to keep this up, although observed leaving clinic NWB on R. R knee AROM progressing well with flexion ROM for STG #3 now partially met and extension improving both in open and closed chain activities. Some increased soreness noted after ROM and srrengthening exercises, therefore ice pack applied to end session.    Rehab Potential  Good    Clinical Impairments Affecting Rehab Potential  B sensorineural hearing loss - cochlear implant & pt reads lips    PT Treatment/Interventions  Patient/family education;Therapeutic exercise;Therapeutic activities;Functional mobility training;Gait training;Stair training;DME Instruction;Neuromuscular re-education;Balance training;Manual techniques;Passive range of motion;Taping;Dry needling;Cryotherapy;Vasopneumatic Device;Moist Heat;Electrical Stimulation;Iontophoresis 11m/ml Dexamethasone;ADLs/Self Care Home Management    PT Next Visit Plan  Instruction in gait progression to WBAT R with crutches    Consulted and Agree with Plan of Care  Patient        Patient will benefit from skilled therapeutic intervention in order to improve the following  deficits and impairments:  Pain, Decreased range of motion, Impaired flexibility, Increased muscle spasms, Decreased strength, Difficulty walking, Abnormal gait, Decreased activity tolerance, Decreased balance, Decreased endurance, Postural dysfunction, Improper body mechanics  Visit Diagnosis: Acute pain of right knee  Stiffness of right knee, not elsewhere classified  Muscle weakness (generalized)  Other abnormalities of gait and mobility  Difficulty in walking, not elsewhere classified     Problem List Patient Active Problem List   Diagnosis Date Noted  . Sensorineural hearing loss, bilateral 06/28/2014  . Osteoporosis 02/21/2014  . Nephrolithiasis 11/02/2013  . Hyperlipidemia 03/07/2013  . Calculus of ureter 02/14/2013  . Essential hypertension 02/01/2013  . Gastro-esophageal reflux disease without esophagitis 08/01/2012  . Irritable bowel syndrome without diarrhea 08/01/2012  . Migraine, unspecified, not intractable, without status migrainosus 12/31/2011    Percival Spanish, PT, MPT 03/11/2018, 12:17 PM  [facility] [address]  [place], [place], [address] Phone: [phone]   Fax:  [phone]  Name: Lexa Coronado MRN: [id number] Date of Birth: [birth year]

## 2018-03-15 ENCOUNTER — Encounter: Payer: Self-pay | Admitting: Physical Therapy

## 2018-03-15 ENCOUNTER — Ambulatory Visit: Admitting: Physical Therapy

## 2018-03-15 DIAGNOSIS — R262 Difficulty in walking, not elsewhere classified: Secondary | ICD-10-CM

## 2018-03-15 DIAGNOSIS — M6281 Muscle weakness (generalized): Secondary | ICD-10-CM

## 2018-03-15 DIAGNOSIS — M25561 Pain in right knee: Secondary | ICD-10-CM | POA: Diagnosis not present

## 2018-03-15 DIAGNOSIS — R2689 Other abnormalities of gait and mobility: Secondary | ICD-10-CM

## 2018-03-15 DIAGNOSIS — M25661 Stiffness of right knee, not elsewhere classified: Secondary | ICD-10-CM

## 2018-03-15 NOTE — Therapy (Addendum)
[facility] [address]  [address] [place], [place], [address] Phone: [phone]   Fax:  [phone]  Physical Therapy Treatment  Patient Details  Name: Joan ChimesKerstin Beehler MRN: [id number] Date of Birth: 01/23/1961 Referring Provider (PT): Norton BlizzardShane Hudnall, MD   Encounter Date: 03/15/2018  PT End of Session - 03/15/18 1450    Visit Number  4    Number of Visits  16    Date for PT Re-Evaluation  04/14/18    Authorization Type  [employer] - PT only    PT Start Time  1450    PT Stop Time  1541    PT Time Calculation (min)  51 min    Activity Tolerance  Patient tolerated treatment well;Patient limited by pain    Behavior During Therapy  [facility] assessed/performed       Past Medical History:  Diagnosis Date  . GERD (gastroesophageal reflux disease)   . Hearing impaired   . Kidney stone     Past Surgical History:  Procedure Laterality Date  . ABDOMINAL HYSTERECTOMY      There were no vitals filed for this visit.  Subjective Assessment - 03/15/18 1454    Subjective  Pt reporting increased soreness today, but states she had a busy weekend - was on her feet more and had tried to do more cleaning around the house.    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    Patient Stated Goals  "to get off the crutches and walk"    Currently in Pain?  Yes    Pain Score  7     Pain Location  Knee    Pain Orientation  Right;Medial    Pain Descriptors / Indicators  Sore    Pain Type  Acute pain         [facility] PT Assessment - 03/15/18 1450      AROM   Right Knee Extension  5    Right Knee Flexion  110                   [facility] Adult PT Treatment/Exercise - 03/15/18 1450      Ambulation/Gait   Ambulation/Gait Assistance  5: Supervision    Ambulation/Gait Assistance Details  Review of step-through pattern with crutches  WBAT on R with cues to increase L step length to promote more symmetrical step pattern.    Ambulation Distance (Feet)  90 Feet    Assistive device  Crutches    Gait Pattern  Step-to pattern;Step-through pattern;Right flexed knee in stance;Decreased weight shift to right;Decreased stance time - right;Decreased stride length;Decreased step length - right;Decreased step length - left   WBAT R   Ambulation Surface  Level;Indoor      Exercises   Exercises  Knee/Hip      Knee/Hip Exercises: Stretches   Passive Hamstring Stretch  Right;30 seconds;2 reps    Passive Hamstring Stretch Limitations  supine: 1st rep + gastro with strap at forefoot; 2nd rep with strap at midfoot     ITB Stretch  Right;30 seconds;2 reps    ITB Stretch Limitations  supine with strap      Knee/Hip Exercises: Aerobic   Recumbent Bike  full revolutions (no resistance) x 6 min      Knee/Hip Exercises: Standing   Terminal Knee Extension  Right;10 reps;Strengthening    Terminal Knee  Extension Limitations  pressing into small ball on wall    Functional Squat  10 reps;3 seconds    Functional Squat Limitations  counter minisquat - cues for even weight shift & avoiding knees foward of toes      Knee/Hip Exercises: Supine   Quad Sets  Right;10 reps;Strengthening   5" hold   Quad Sets Limitations  ankle elevated on rolled pillow    Short Arc Quad Sets  Right;10 reps;AROM    Short Arc [employer] Limitations  8" bolster    Patellar Mobs  R knee medial & inf/sup patellar glides    Knee Flexion  Right;AAROM;10 reps    Knee Flexion Limitations  HS curls with heels on peanut ball      Cryotherapy   Number Minutes Cryotherapy  --   pt opting to ice at home     Manual Therapy   Manual Therapy  Soft tissue mobilization;Myofascial release;Joint mobilization    Manual therapy comments  supine    Joint Mobilization  R knee patellar mobs: medial and sup/inf glides    Soft tissue mobilization  R medial gastroc    Myofascial Release   manual TPR to R medial gastroc               PT Short Term Goals - 03/15/18 1458      PT SHORT TERM GOAL #1   Title  Independent with initial HEP    Status  Achieved      PT SHORT TERM GOAL #2   Title  Patient will ambuate WBAT on R in hinged brace with single axillary crutch or LRAD     Status  On-going      PT SHORT TERM GOAL #3   Title  R knee AROM >/= 5-90 degrees    Status  Achieved        PT Long Term Goals - 03/08/18 1640      PT LONG TERM GOAL #1   Title  Independent with ongoing/advanced HEP    Status  On-going      PT LONG TERM GOAL #2   Title  R knee AROM >/= 0-130 dg to allow for normal gait and stair negotiation    Status  On-going      PT LONG TERM GOAL #3   Title  R hip and knee strength >/= 4+/5 for improved stability    Status  On-going      PT LONG TERM GOAL #4   Title  Patient will ambuate with normal gait pattern w/o AD    Status  On-going      PT LONG TERM GOAL #5   Title  Patient will report ability to complete ADLs and light household chores w/o restriction due to R knee pain, LOM or weakness    Status  On-going            Plan - 03/15/18 1458    Clinical Impression Statement  Mariachristina reporting increased pain today after increased activity over the weekend as well as trying to replicate many of the exercises completed during prior therapy session in addition to her HEP, but tolerated most exercises well during therapy session. Increased R knee pain at medial joint line noted with gastroc stretch with ttp and increased muscle tension noted in medial head of R gastroc - improved after manual therapy to gastroc. If this persists, may consider taping vs ionto patch to reduce irritation/inflammation. R knee ROM progressing well with AROM now 5-110  dg. Pt continues to demonstrate tendency to ambulate NWB on R in clinic but states she has been placing more weight on R leg at home - reviewed proper sequencing of gait WBAT with crutches  encouraging increased stride length on L to complete step through pattern.    Rehab Potential  Good    Clinical Impairments Affecting Rehab Potential  B sensorineural hearing loss - cochlear implant & pt reads lips    PT Treatment/Interventions  Patient/family education;Therapeutic exercise;Therapeutic activities;Functional mobility training;Gait training;Stair training;DME Instruction;Neuromuscular re-education;Balance training;Manual techniques;Passive range of motion;Taping;Dry needling;Cryotherapy;Vasopneumatic Device;Moist Heat;Electrical Stimulation;Iontophoresis 4mg /ml Dexamethasone;ADLs/Self Care Home Management    PT Next Visit Plan  Reinforce gait progression to WBAT R with crutches, weaning to single axillary crutch as able; R knee ROM and LE strengthening    Consulted and Agree with Plan of Care  Patient       Patient will benefit from skilled therapeutic intervention in order to improve the following deficits and impairments:  Pain, Decreased range of motion, Impaired flexibility, Increased muscle spasms, Decreased strength, Difficulty walking, Abnormal gait, Decreased activity tolerance, Decreased balance, Decreased endurance, Postural dysfunction, Improper body mechanics  Visit Diagnosis: Acute pain of right knee  Stiffness of right knee, not elsewhere classified  Muscle weakness (generalized)  Other abnormalities of gait and mobility  Difficulty in walking, not elsewhere classified     Problem List Patient Active Problem List   Diagnosis Date Noted  . Sensorineural hearing loss, bilateral 06/28/2014  . Osteoporosis 02/21/2014  . Nephrolithiasis 11/02/2013  . Hyperlipidemia 03/07/2013  . Calculus of ureter 02/14/2013  . Essential hypertension 02/01/2013  . Gastro-esophageal reflux disease without esophagitis 08/01/2012  . Irritable bowel syndrome without diarrhea 08/01/2012  . Migraine, unspecified, not intractable, without status migrainosus 12/31/2011    Marry GuanJoAnne  M Brantlee Penn, PT, MPT 03/15/2018, 6:45 PM  [facility] [address]  [address] [place], [place], [address] Phone: [phone]   Fax:  [phone]  Name: Joan ChimesKerstin Folger MRN: [id number] Date of Birth: 03/09/1960

## 2018-03-18 ENCOUNTER — Encounter: Payer: Self-pay | Admitting: Physical Therapy

## 2018-03-18 ENCOUNTER — Ambulatory Visit: Admitting: Physical Therapy

## 2018-03-18 DIAGNOSIS — R262 Difficulty in walking, not elsewhere classified: Secondary | ICD-10-CM

## 2018-03-18 DIAGNOSIS — R2689 Other abnormalities of gait and mobility: Secondary | ICD-10-CM

## 2018-03-18 DIAGNOSIS — M25561 Pain in right knee: Secondary | ICD-10-CM

## 2018-03-18 DIAGNOSIS — M6281 Muscle weakness (generalized): Secondary | ICD-10-CM

## 2018-03-18 DIAGNOSIS — M25661 Stiffness of right knee, not elsewhere classified: Secondary | ICD-10-CM

## 2018-03-18 NOTE — Therapy (Signed)
[facility] [address]  [address] [place], [place], [address] Phone: [phone]   Fax:  [phone]  Physical Therapy Treatment  Patient Details  Name: Joan Horn MRN: [id number] Date of Birth: 11/20/1960 Referring Provider (PT): Norton Blizzard, MD   Encounter Date: 03/18/2018  PT End of Session - 03/18/18 1137    Visit Number  5    Number of Visits  16    Date for PT Re-Evaluation  04/14/18    Authorization Type  [employer] - PT only    PT Start Time  1051    PT Stop Time  1133    PT Time Calculation (min)  42 min    Equipment Utilized During Treatment  Gait belt   R knee hinged brace   Activity Tolerance  Patient tolerated treatment well;Patient limited by pain    Behavior During Therapy  [facility] for tasks assessed/performed       Past Medical History:  Diagnosis Date  . GERD (gastroesophageal reflux disease)   . Hearing impaired   . Kidney stone     Past Surgical History:  Procedure Laterality Date  . ABDOMINAL HYSTERECTOMY      There were no vitals filed for this visit.  Subjective Assessment - 03/18/18 1051    Subjective  Reports everything is good. Has been performing HEP 3x a day.     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    Patient Stated Goals  "to get off the crutches and walk"    Currently in Pain?  No/denies                       [facility] Adult PT Treatment/Exercise - 03/18/18 0001      Ambulation/Gait   Ambulation/Gait Assistance  4: Min guard    Ambulation Distance (Feet)  90 Feet    Assistive device  L Axillary Crutch    Gait Pattern  Step-to pattern;Step-through pattern;Right flexed knee in stance;Decreased weight shift to right;Decreased stance time - right;Decreased stride length;Decreased step length - right;Decreased step length - left;Trunk flexed    Ambulation Surface   Level;Indoor    Gait Comments  gait training with 1 crutch- cues for TKE at heel strike and sequencing of crutch      Knee/Hip Exercises: Stretches   Passive Hamstring Stretch  Right;30 seconds;2 reps    Passive Hamstring Stretch Limitations  supine strap    Quad Stretch  Right;2 reps;30 seconds    Quad Stretch Limitations  prone with strap     ITB Stretch  Right;30 seconds;2 reps    ITB Stretch Limitations  supine with strap      Knee/Hip Exercises: Aerobic   Recumbent Bike  L1 x full revolutions   c/o R patellar tendon pain but able to continue     Knee/Hip Exercises: Standing   Functional Squat  10 reps;3 seconds    Functional Squat Limitations  minisquat at treadmill rail- cues for avoiding knees foward of toes    Other Standing Knee Exercises  R wt shift + L foot step up on 4" and 6" step with CGA 5x 4". 10x 6"   cues for TKE on R     Knee/Hip Exercises: Supine   Heel Slides  AAROM;Right;10 reps    Heel Slides Limitations  10x5" on orange pball with strap to  tolerance    Bridges  Strengthening;Both;1 set;10 reps    Bridges Limitations  straight leg bridge on orange pball with cues for TKE on R LE    Straight Leg Raise with External Rotation  Strengthening;Right;1 set;10 reps    Straight Leg Raise with External Rotation Limitations  lacking TKE      Knee/Hip Exercises: Prone   Hip Extension  Strengthening;Right;Left;1 set;10 reps   patient with considerable difficulty; TC's to avoid rotation   Hip Extension Limitations  prone donkey kicks             PT Education - 03/18/18 1137    Education Details  update to HEP; Advised patient to continue practicing ambulating with single crutch at home, continue with 2 crutches in the community    Person(s) Educated  Patient    Methods  Explanation;Demonstration;Tactile cues;Verbal cues;Handout    Comprehension  Verbalized understanding;Returned demonstration       PT Short Term Goals - 03/15/18 1458      PT SHORT  TERM GOAL #1   Title  Independent with initial HEP    Status  Achieved      PT SHORT TERM GOAL #2   Title  Patient will ambuate WBAT on R in hinged brace with single axillary crutch or LRAD     Status  On-going      PT SHORT TERM GOAL #3   Title  R knee AROM >/= 5-90 degrees    Status  Achieved        PT Long Term Goals - 03/08/18 1640      PT LONG TERM GOAL #1   Title  Independent with ongoing/advanced HEP    Status  On-going      PT LONG TERM GOAL #2   Title  R knee AROM >/= 0-130 dg to allow for normal gait and stair negotiation    Status  On-going      PT LONG TERM GOAL #3   Title  R hip and knee strength >/= 4+/5 for improved stability    Status  On-going      PT LONG TERM GOAL #4   Title  Patient will ambuate with normal gait pattern w/o AD    Status  On-going      PT LONG TERM GOAL #5   Title  Patient will report ability to complete ADLs and light household chores w/o restriction due to R knee pain, LOM or weakness    Status  On-going            Plan - 03/18/18 1138    Clinical Impression Statement  Patient arrived to session WBAT on R LE with 2 crutches. Noted consistent compliance with HEP. Worked on gait training with 1 crutch- cues for TKE at heel strike and sequencing of crutch required but patient showing good stability throughout. Introduced SLR with ER with patient showing some difficulty and lacking TKE. Continued to require cues for TKE with straight leg bridge with good effort to correct. Introduced prone donkey kicks with patient showing considerable difficulty d/t glute weakness, especially on R LE. Updated HEP to include this exercise. Worked on increasing R LE WBing with forward step ups with L LE and CGA. Advised patient to continue practicing ambulating with single crutch at home, continue with 2 crutches in the community. Patient reported understanding and with no complaints at end of session. Declined modalities.     Clinical Impairments  Affecting Rehab Potential  B sensorineural hearing loss -  cochlear implant & pt reads lips    PT Treatment/Interventions  Patient/family education;Therapeutic exercise;Therapeutic activities;Functional mobility training;Gait training;Stair training;DME Instruction;Neuromuscular re-education;Balance training;Manual techniques;Passive range of motion;Taping;Dry needling;Cryotherapy;Vasopneumatic Device;Moist Heat;Electrical Stimulation;Iontophoresis 4mg /ml Dexamethasone;ADLs/Self Care Home Management    PT Next Visit Plan  continue gait training with single axillary crutch; R knee ROM and LE strengthening    Consulted and Agree with Plan of Care  Patient       Patient will benefit from skilled therapeutic intervention in order to improve the following deficits and impairments:  Pain, Decreased range of motion, Impaired flexibility, Increased muscle spasms, Decreased strength, Difficulty walking, Abnormal gait, Decreased activity tolerance, Decreased balance, Decreased endurance, Postural dysfunction, Improper body mechanics  Visit Diagnosis: Acute pain of right knee  Stiffness of right knee, not elsewhere classified  Muscle weakness (generalized)  Other abnormalities of gait and mobility  Difficulty in walking, not elsewhere classified     Problem List Patient Active Problem List   Diagnosis Date Noted  . Sensorineural hearing loss, bilateral 06/28/2014  . Osteoporosis 02/21/2014  . Nephrolithiasis 11/02/2013  . Hyperlipidemia 03/07/2013  . Calculus of ureter 02/14/2013  . Essential hypertension 02/01/2013  . Gastro-esophageal reflux disease without esophagitis 08/01/2012  . Irritable bowel syndrome without diarrhea 08/01/2012  . Migraine, unspecified, not intractable, without status migrainosus 12/31/2011    Anette GuarneriYevgeniya Samanth Mirkin, PT, DPT 03/18/18 11:41 AM   [facility] [address]  [address] [place], [place],  [address] Phone: [phone]   Fax:  [phone]  Name: Joan Horn MRN: [id number] Date of Birth: 11/25/1960

## 2018-03-22 ENCOUNTER — Encounter: Payer: Self-pay | Admitting: Physical Therapy

## 2018-03-22 ENCOUNTER — Ambulatory Visit: Attending: Family Medicine | Admitting: Physical Therapy

## 2018-03-22 DIAGNOSIS — R2689 Other abnormalities of gait and mobility: Secondary | ICD-10-CM

## 2018-03-22 DIAGNOSIS — M6281 Muscle weakness (generalized): Secondary | ICD-10-CM

## 2018-03-22 DIAGNOSIS — M25561 Pain in right knee: Secondary | ICD-10-CM | POA: Diagnosis not present

## 2018-03-22 DIAGNOSIS — R262 Difficulty in walking, not elsewhere classified: Secondary | ICD-10-CM | POA: Insufficient documentation

## 2018-03-22 DIAGNOSIS — M25661 Stiffness of right knee, not elsewhere classified: Secondary | ICD-10-CM | POA: Insufficient documentation

## 2018-03-22 NOTE — Therapy (Signed)
[facility] [address]  [address] [place], [place], [address] Phone: [phone]   Fax:  [phone]  Physical Therapy Treatment  Patient Details  Name: Joan Horn MRN: [id number] Date of Birth: [birth year] Referring Provider (PT): Norton Blizzard, MD   Encounter Date: 03/22/2018  PT End of Session - 03/22/18 1533    Visit Number  6    Number of Visits  16    Date for PT Re-Evaluation  04/14/18    Authorization Type  [employer] - PT only    PT Start Time  1446    PT Stop Time  1537   ice pack   PT Time Calculation (min)  51 min    Equipment Utilized During Treatment  Other (comment)   R knee hinged brace   Activity Tolerance  Patient tolerated treatment well;Patient limited by pain    Behavior During Therapy  [facility] for tasks assessed/performed       Past Medical History:  Diagnosis Date  . GERD (gastroesophageal reflux disease)   . Hearing impaired   . Kidney stone     Past Surgical History:  Procedure Laterality Date  . ABDOMINAL HYSTERECTOMY      There were no vitals filed for this visit.  Subjective Assessment - 03/22/18 1448    Subjective  Reports that she had an MD appointment but it was rescheduled for the 14th. Reports R foot pain since starting walking with single crutch. Has been walking with single crutch "a lot" a home. Also notes increased R knee soreness.     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    Patient Stated Goals  "to get off the crutches and walk"    Currently in Pain?  Yes    Pain Score  3     Pain Location  Knee    Pain Orientation  Right;Medial    Pain Descriptors / Indicators  Sore    Pain Type  Acute pain                       [facility] Adult PT Treatment/Exercise - 03/22/18 0001      Ambulation/Gait   Ambulation/Gait Assistance  5: Supervision    Ambulation Distance  (Feet)  70 Feet    Assistive device  L Axillary Crutch    Gait Pattern  Step-to pattern;Step-through pattern;Right flexed knee in stance;Decreased weight shift to right;Decreased stance time - right;Decreased stride length;Decreased step length - right;Decreased step length - left;Trunk flexed    Gait Comments  good improvement in sequencing with crutch, good heel strike and improved TKE      Self-Care   Self-Care  Other Self-Care Comments    Other Self-Care Comments   edu and practice of R foot self-STM to plantar surface      Knee/Hip Exercises: Stretches   Higher education careers adviser reps;30 seconds    Quad Stretch Limitations  prone with strap     ITB Stretch  Right;30 seconds;2 reps    ITB Stretch Limitations  supine with strap      Knee/Hip Exercises: Aerobic   Recumbent Bike  L1 x full revolutions/partial revolutions d/t added pillow behind back      Knee/Hip Exercises: Supine   Bridges with [employer]  Strengthening;Both;1 set;10 reps   limited range   Bridges with Clamshell  Strengthening;Both;1  set;10 reps   unable to tolerate d/t R knee pain   Straight Leg Raises  Right;10 reps;Strengthening    Straight Leg Raises Limitations  1#; lacking TKE    Straight Leg Raise with External Rotation  Strengthening;Right;1 set;10 reps    Straight Leg Raise with External Rotation Limitations  lacking TKE      Knee/Hip Exercises: Prone   Hip Extension  Strengthening;Right;Left;1 set;10 reps    Hip Extension Limitations  prone donkey kicks   heavy manual assistance to avoid trunk rotationl improved R     Cryotherapy   Number Minutes Cryotherapy  10 Minutes    Cryotherapy Location  Knee   R   Type of Cryotherapy  Ice pack      Manual Therapy   Manual Therapy  Taping    Kinesiotex  Edema      Kinesiotix   Edema  R anterior knee edema weave pattern             PT Education - 03/22/18 1531    Education Details  update to HEP; advised to remove prone donkey kicks from HEP  d/t LBP; edu on self-STM to R foot for relief of pain; edu on KT tape wear time, removal, and precautions; advised to slowly increase ambulation time with single crutch to avoid flare up    Person(s) Educated  Patient    Methods  Explanation;Demonstration;Tactile cues;Verbal cues;Handout    Comprehension  Verbalized understanding;Returned demonstration       PT Short Term Goals - 03/15/18 1458      PT SHORT TERM GOAL #1   Title  Independent with initial HEP    Status  Achieved      PT SHORT TERM GOAL #2   Title  Patient will ambuate WBAT on R in hinged brace with single axillary crutch or LRAD     Status  On-going      PT SHORT TERM GOAL #3   Title  R knee AROM >/= 5-90 degrees    Status  Achieved        PT Long Term Goals - 03/08/18 1640      PT LONG TERM GOAL #1   Title  Independent with ongoing/advanced HEP    Status  On-going      PT LONG TERM GOAL #2   Title  R knee AROM >/= 0-130 dg to allow for normal gait and stair negotiation    Status  On-going      PT LONG TERM GOAL #3   Title  R hip and knee strength >/= 4+/5 for improved stability    Status  On-going      PT LONG TERM GOAL #4   Title  Patient will ambuate with normal gait pattern w/o AD    Status  On-going      PT LONG TERM GOAL #5   Title  Patient will report ability to complete ADLs and light household chores w/o restriction due to R knee pain, LOM or weakness    Status  On-going            Plan - 03/22/18 1533    Clinical Impression Statement  Patient arrived to session with report of R plantar surface pain and slight increase in R knee pain after trying to ambulate with single crutch at home. Reports that she has walked "a lot" with single crutch. Educated patient on self-STM with ball and frozen water bottle for pain relief to foot. Patient demonstrated good improvement  in ambulation- able to improve sequencing with 1 crutch and improved TKE with heel strike. However, patient with pain to R  plantar surface of foot. Opted for supine ther-ex today to avoid exacerbation of foot pain. Introduced bridge with ball squeeze with good tolerance, however patient with report of significant R knee pain with bridge with clamshell. Discontinued this exercise. Tolerated addition of light weight with SLR; still lacking TKE with this activity. Asked patient to remove brace from R knee at end of session which revealed mild-moderate edema surrounding patella and pes anserine region. Used KT tape over R anterior knee for edema and placed patient on ice for relief of pain and edema. Advised patient to slowly increase length of time using single crutch to avoid exacerbation of knee and foot pain. Patient reported understanding.      Clinical Impairments Affecting Rehab Potential  B sensorineural hearing loss - cochlear implant & pt reads lips    PT Treatment/Interventions  Patient/family education;Therapeutic exercise;Therapeutic activities;Functional mobility training;Gait training;Stair training;DME Instruction;Neuromuscular re-education;Balance training;Manual techniques;Passive range of motion;Taping;Dry needling;Cryotherapy;Vasopneumatic Device;Moist Heat;Electrical Stimulation;Iontophoresis 4mg /ml Dexamethasone;ADLs/Self Care Home Management    PT Next Visit Plan  MD F/U note; assess R foot pain and R knee edema    Consulted and Agree with Plan of Care  Patient       Patient will benefit from skilled therapeutic intervention in order to improve the following deficits and impairments:  Pain, Decreased range of motion, Impaired flexibility, Increased muscle spasms, Decreased strength, Difficulty walking, Abnormal gait, Decreased activity tolerance, Decreased balance, Decreased endurance, Postural dysfunction, Improper body mechanics  Visit Diagnosis: Acute pain of right knee  Stiffness of right knee, not elsewhere classified  Muscle weakness (generalized)  Other abnormalities of gait and  mobility  Difficulty in walking, not elsewhere classified     Problem List Patient Active Problem List   Diagnosis Date Noted  . Sensorineural hearing loss, bilateral 06/28/2014  . Osteoporosis 02/21/2014  . Nephrolithiasis 11/02/2013  . Hyperlipidemia 03/07/2013  . Calculus of ureter 02/14/2013  . Essential hypertension 02/01/2013  . Gastro-esophageal reflux disease without esophagitis 08/01/2012  . Irritable bowel syndrome without diarrhea 08/01/2012  . Migraine, unspecified, not intractable, without status migrainosus 12/31/2011    Anette Guarneri, PT, DPT 03/22/18 5:10 PM   [facility] [address]  [address] [place], [place], [address] Phone: [phone]   Fax:  [phone]  Name: Joan Horn MRN: [id number] Date of Birth: 1960/06/08

## 2018-03-25 ENCOUNTER — Ambulatory Visit: Admitting: Physical Therapy

## 2018-03-25 ENCOUNTER — Ambulatory Visit: Admitting: Family Medicine

## 2018-03-25 ENCOUNTER — Encounter: Payer: Self-pay | Admitting: Physical Therapy

## 2018-03-25 DIAGNOSIS — R262 Difficulty in walking, not elsewhere classified: Secondary | ICD-10-CM

## 2018-03-25 DIAGNOSIS — M25561 Pain in right knee: Secondary | ICD-10-CM | POA: Diagnosis not present

## 2018-03-25 DIAGNOSIS — M25661 Stiffness of right knee, not elsewhere classified: Secondary | ICD-10-CM

## 2018-03-25 DIAGNOSIS — M6281 Muscle weakness (generalized): Secondary | ICD-10-CM

## 2018-03-25 DIAGNOSIS — R2689 Other abnormalities of gait and mobility: Secondary | ICD-10-CM

## 2018-03-25 NOTE — Therapy (Signed)
[facility] [address]  [place], [place], [address] Phone: [phone]   Fax:  [phone]  Physical Therapy Treatment  Patient Details  Name: Joan Horn MRN: [id number] Date of Birth: 08-28-1960 Referring Provider (PT): Karlton Lemon, MD   Encounter Date: 03/25/2018  PT End of Session - 03/25/18 1007    Visit Number  7    Number of Visits  16    Date for PT Re-Evaluation  04/14/18    Authorization Type  [employer] - PT only    PT Start Time  0930    PT Stop Time  1016    PT Time Calculation (min)  46 min    Equipment Utilized During Treatment  Other (comment)   R knee hinged brace   Activity Tolerance  Patient tolerated treatment well;Patient limited by pain    Behavior During Therapy  [facility] for tasks assessed/performed       Past Medical History:  Diagnosis Date  . GERD (gastroesophageal reflux disease)   . Hearing impaired   . Kidney stone     Past Surgical History:  Procedure Laterality Date  . ABDOMINAL HYSTERECTOMY      There were no vitals filed for this visit.  Subjective Assessment - 03/25/18 0935    Subjective  Reports that she had a rough day yesterday- R foot hurting her. Did try frozen water bottle massage which she says felt good. Also notiing that pain now is over the dorsum of the foot as well. Has continued to walk with single crutch at home, but noting R medial knee pain and swelling.     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    Patient Stated Goals  "to get off the crutches and walk"    Currently in Pain?  Yes    Pain Score  1     Pain Location  Knee    Pain Orientation  Right;Medial    Pain Descriptors / Indicators  Sore    Pain Type  Acute pain                       [facility] Adult PT Treatment/Exercise - 03/25/18 0001      Knee/Hip Exercises: Stretches   Passive  Hamstring Stretch  Right;30 seconds;2 reps    Passive Hamstring Stretch Limitations  supine strap    Gastroc Stretch  Right;30 seconds;2 reps    Gastroc Stretch Limitations  long sitting with strap      Knee/Hip Exercises: Aerobic   Recumbent Bike  L1 x 37mn full revolutions/partial revolutions d/t added pillow behind back      Knee/Hip Exercises: Supine   Bridges with [facility] Strengthening;Both;1 set;15 reps   c/o R medial knee pain radiating to medial calf   Straight Leg Raises  Right;10 reps;Strengthening    Straight Leg Raises Limitations  1#; lacking TKE    Straight Leg Raise with External Rotation  Strengthening;Right;1 set;10 reps    Straight Leg Raise with External Rotation Limitations  1#; lacking TKE      Vasopneumatic   Number Minutes Vasopneumatic   15 minutes   ended 4 min early d/t discomfort   Vasopnuematic Location   Knee   R   Vasopneumatic Pressure  Low    Vasopneumatic Temperature   coldest      Manual Therapy  Manual Therapy  Taping;Soft tissue mobilization;Joint mobilization    Manual therapy comments  supine    Joint Mobilization  very gentle R patellar mobilizations in all planes grade II to tolerance- unable to tolerate superior/inferior directions d/t pain     Soft tissue mobilization  STM to R distal medial HS- soft tissue restriction and tenderness noted    Myofascial Release  manual TPR to R distal medial HS    Kinesiotex  Create Space      Kinesiotix   Edema  R chondramalacia patellae pattern- 50% stretch surrounding patella, 80% over patellar tendon               PT Short Term Goals - 03/25/18 1026      PT SHORT TERM GOAL #1   Title  Independent with initial HEP    Status  Achieved      PT SHORT TERM GOAL #2   Title  Patient will ambuate WBAT on R in hinged brace with single axillary crutch or LRAD     Status  Partially Met   able to perform, but with pain     PT SHORT TERM GOAL #3   Title  R knee AROM >/= 5-90 degrees     Status  Achieved        PT Long Term Goals - 03/08/18 1640      PT LONG TERM GOAL #1   Title  Independent with ongoing/advanced HEP    Status  On-going      PT LONG TERM GOAL #2   Title  R knee AROM >/= 0-130 dg to allow for normal gait and stair negotiation    Status  On-going      PT LONG TERM GOAL #3   Title  R hip and knee strength >/= 4+/5 for improved stability    Status  On-going      PT LONG TERM GOAL #4   Title  Patient will ambuate with normal gait pattern w/o AD    Status  On-going      PT LONG TERM GOAL #5   Title  Patient will report ability to complete ADLs and light household chores w/o restriction due to R knee pain, LOM or weakness    Status  On-going            Plan - 03/25/18 1022    Clinical Impression Statement  Patient arrived to session with report of increased R knee and foot pain yesterday, but feeling better today. Observed R knee at start of session- moderate edema to R inferomedial knee and suprapatellar region remaining compared to last session, but improved. Patient complaining of R dorsal foot pain- very slight edema over navicular. Reports improvement in plantar foot pain with frozen water bottle massage at home. Patient reports that she has continued to walk with single crutch at home- advised her try switching back to 2 crutches over the weekend to see if symptoms improve. Patient reported understanding. Focused session on manual therapy to improve R patellar mobility and distal medial HS trigger points and soft tissue restriction. Patient with poor tolerance of patellar mobs, especially in superior/inferior directions. Received tape to R knee for pain relief. Patient aware of precautions and wear time. Ended session with [employer] to R knee for edema and pain relief. Patient requested to discontinue this early d/t discomfort. Plan to reassess R knee and foot pain and edema next session.     Clinical Impairments Affecting Rehab Potential  B  sensorineural hearing loss - cochlear implant & pt reads lips    PT Treatment/Interventions  Patient/family education;Therapeutic exercise;Therapeutic activities;Functional mobility training;Gait training;Stair training;DME Instruction;Neuromuscular re-education;Balance training;Manual techniques;Passive range of motion;Taping;Dry needling;Cryotherapy;Vasopneumatic Device;Moist Heat;Electrical Stimulation;Iontophoresis 94m/ml Dexamethasone;ADLs/Self Care Home Management    PT Next Visit Plan  MD F/U note; assess R foot pain and R knee edema    Consulted and Agree with Plan of Care  Patient       Patient will benefit from skilled therapeutic intervention in order to improve the following deficits and impairments:  Pain, Decreased range of motion, Impaired flexibility, Increased muscle spasms, Decreased strength, Difficulty walking, Abnormal gait, Decreased activity tolerance, Decreased balance, Decreased endurance, Postural dysfunction, Improper body mechanics  Visit Diagnosis: Acute pain of right knee  Stiffness of right knee, not elsewhere classified  Muscle weakness (generalized)  Other abnormalities of gait and mobility  Difficulty in walking, not elsewhere classified     Problem List Patient Active Problem List   Diagnosis Date Noted  . Sensorineural hearing loss, bilateral 06/28/2014  . Osteoporosis 02/21/2014  . Nephrolithiasis 11/02/2013  . Hyperlipidemia 03/07/2013  . Calculus of ureter 02/14/2013  . Essential hypertension 02/01/2013  . Gastro-esophageal reflux disease without esophagitis 08/01/2012  . Irritable bowel syndrome without diarrhea 08/01/2012  . Migraine, unspecified, not intractable, without status migrainosus 12/31/2011    YJanene Harvey PT, DPT 03/25/18 10:28 AM   [facility] [address] [place] [address]Phone: [phone]  Fax:  [phone] Name: KYalitza TeedMRN: [id number] of Birth: 404-28-1962

## 2018-03-29 ENCOUNTER — Encounter: Payer: Self-pay | Admitting: Physical Therapy

## 2018-03-29 ENCOUNTER — Ambulatory Visit: Admitting: Physical Therapy

## 2018-03-29 DIAGNOSIS — M6281 Muscle weakness (generalized): Secondary | ICD-10-CM

## 2018-03-29 DIAGNOSIS — M25561 Pain in right knee: Secondary | ICD-10-CM

## 2018-03-29 DIAGNOSIS — R262 Difficulty in walking, not elsewhere classified: Secondary | ICD-10-CM

## 2018-03-29 DIAGNOSIS — R2689 Other abnormalities of gait and mobility: Secondary | ICD-10-CM

## 2018-03-29 DIAGNOSIS — M25661 Stiffness of right knee, not elsewhere classified: Secondary | ICD-10-CM

## 2018-03-29 NOTE — Therapy (Signed)
[facility] [address]  [place], [place], [address] Phone: [phone]   Fax:  [phone]  Physical Therapy Treatment  Patient Details  Name: Joan Horn MRN: [id number] Date of Birth: [birth year] Referring Provider (PT): Karlton Lemon, MD  Progress Note  Reporting Period 03/03/2018 to 03/29/2018  See note below for Objective Data and Assessment of Progress/Goals.    Encounter Date: 03/29/2018  PT End of Session - 03/29/18 1446    Visit Number  8    Number of Visits  16    Date for PT Re-Evaluation  04/14/18    Authorization Type  [employer] - PT only    PT Start Time  1446    PT Stop Time  1537    PT Time Calculation (min)  51 min    Equipment Utilized During Treatment  Other (comment)   R knee hinged brace   Activity Tolerance  Patient tolerated treatment well    Behavior During Therapy  WFL for tasks assessed/performed       Past Medical History:  Diagnosis Date  . GERD (gastroesophageal reflux disease)   . Hearing impaired   . Kidney stone     Past Surgical History:  Procedure Laterality Date  . ABDOMINAL HYSTERECTOMY      There were no vitals filed for this visit.  Subjective Assessment - 03/29/18 1450    Subjective  Pt reports she feels like the swelling in her knee has gone down with the taping. Today has been a good day. Reports R foot pain has resolved with wearing more supportive shoes rather than slippers at home.    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    Patient Stated Goals  "to get off the crutches and walk"    Currently in Pain?  No/denies         [facility] PT Assessment - 03/29/18 1446      Assessment   Medical Diagnosis  R patellar fracture    Referring Provider (PT)  Karlton Lemon, MD    Onset  Date/Surgical Date  01/14/18    Next MD Visit  04/01/18      AROM   Right Knee Extension  2    Right Knee Flexion  113      Strength   Right Hip Flexion  4-/5    Right Hip Extension  4-/5    Right Hip ABduction  4/5    Right Hip ADduction  4/5    Left Hip Flexion  4+/5    Left Hip Extension  4/5    Left Hip ABduction  4+/5    Left Hip ADduction  4/5    Right Knee Flexion  3+/5    Right Knee Extension  3+/5    Left Knee Flexion  5/5    Left Knee Extension  5/5                   [facility] Adult PT Treatment/Exercise - 03/29/18 1446      Ambulation/Gait   Ambulation/Gait Assistance  5: Supervision;4: Min guard    Ambulation Distance (Feet)  90 Feet   x 2   Assistive device  L Axillary Crutch;Straight cane   1 lap each with crutch & SPC   Gait Pattern  Step-through pattern;Decreased stride length;Decreased weight shift to  right;Decreased stance time - right;Right flexed knee in stance    Ambulation Surface  Level;Indoor      Exercises   Exercises  Knee/Hip      Knee/Hip Exercises: Stretches   ITB Stretch  Right;30 seconds;2 reps    ITB Stretch Limitations  supine with strap & standing lateral flexion      Knee/Hip Exercises: Aerobic   Recumbent Bike  L1 x 6 min full revolutions (pillow behind back)      Knee/Hip Exercises: Seated   Long Arc Quad  Right;10 reps;AROM    Hamstring Curl  Right;10 reps;Strengthening    Hamstring Limitations  red TB      Cryotherapy   Number Minutes Cryotherapy  --   Pt opting to ice at home d/t not wanting to delay her ride.     Manual Therapy   Manual Therapy  Joint mobilization;Soft tissue mobilization;Taping;Other (comment)    Manual therapy comments  supine    Joint Mobilization  Gentle R knee grade II-III tibial-femoral A/P mobs for flexion ROM & femoral-tibial A/P mobs for knee extension; Very gentle R patellar mobilizations in all planes grade II to tolerance - better tolerance for superior/inferior but still limited motion     Soft tissue mobilization  STM to distal R quads and mid/distal R ITB    Myofascial Release  pin & stretch to R ITB    Other Manual Therapy  Instructed pt in self-STM to quads, ITB & HS using rolling pin.    Kinesiotex  Create Space      Kinesiotix   Edema  R chondromalacia patellae pattern- 50% stretch surrounding patella, 80% over patellar tendon               PT Short Term Goals - 03/29/18 1501      PT SHORT TERM GOAL #1   Title  Independent with initial HEP    Status  Achieved      PT SHORT TERM GOAL #2   Title  Patient will ambuate WBAT on R in hinged brace with single axillary crutch or LRAD     Status  Achieved      PT SHORT TERM GOAL #3   Title  R knee AROM >/= 5-90 degrees    Status  Achieved        PT Long Term Goals - 03/29/18 1502      PT LONG TERM GOAL #1   Title  Independent with ongoing/advanced HEP    Status  On-going      PT LONG TERM GOAL #2   Title  R knee AROM >/= 0-130 dg to allow for normal gait and stair negotiation    Status  On-going      PT LONG TERM GOAL #3   Title  R hip and knee strength >/= 4+/5 for improved stability    Status  On-going      PT LONG TERM GOAL #4   Title  Patient will ambuate with normal gait pattern w/o AD    Status  On-going      PT LONG TERM GOAL #5   Title  Patient will report ability to complete ADLs and light household chores w/o restriction due to R knee pain, LOM or weakness    Status  On-going            Plan - 03/29/18 1502    Clinical Impression Statement  Joan Horn reporting decreased R knee pain and swelling after kinesiotaping last visit as well  as decreased foot pain today which she attributes to wearing more supportive shoes with walking around her home. She had reverted to 2 crutches over the weekend at PT recommendation due to increased pain and swelling last visit, but able to resume L single axillary crutch with ambulation during therapy session today and initiated training in  transition from crutch to [facility] - she remains reluctant to fully weight bear on R LE and continues to demonstrate decreased stride length but is becoming more consistent with step through pattern when using single UE AD. R knee ROM gradually improving with AROM currently 2-113 dg but still notes pain/discomfort at end range of motion with intermittent sense of knee locking - considerable tightness persists in R ITB along with decreased patellar mobility on R mostly likely contributing to restrictions therefore provided instruction in additional stretching and self-STM using rolling pin. R hip and knee strength improving with pt now able to tolerate standardized testing positions allowing for more accurate assessment of strength. Overall pt is progressing well with PT with all STGs now met and pt demonstrating good potential for further improvement.    Rehab Potential  Good    Clinical Impairments Affecting Rehab Potential  B sensorineural hearing loss - cochlear implant & pt reads lips    PT Treatment/Interventions  Patient/family education;Therapeutic exercise;Therapeutic activities;Functional mobility training;Gait training;Stair training;DME Instruction;Neuromuscular re-education;Balance training;Manual techniques;Passive range of motion;Taping;Dry needling;Cryotherapy;Vasopneumatic Device;Moist Heat;Electrical Stimulation;Iontophoresis 90m/ml Dexamethasone;ADLs/Self Care Home Management    PT Next Visit Plan  Gently progress R knee ROM; Progressive R hip and knee strengthening as tolerated; Gait training continuing to wean AD as indicated; Manual therapy for ROM and muscle tension/TPs as indicated with taping as continued benefit noted; Modalties PRN for pain/edema    Consulted and Agree with Plan of Care  Patient       Patient will benefit from skilled therapeutic intervention in order to improve the following deficits and impairments:  Pain, Decreased range of motion, Impaired flexibility, Increased  muscle spasms, Decreased strength, Difficulty walking, Abnormal gait, Decreased activity tolerance, Decreased balance, Decreased endurance, Postural dysfunction, Improper body mechanics  Visit Diagnosis: Acute pain of right knee  Stiffness of right knee, not elsewhere classified  Muscle weakness (generalized)  Other abnormalities of gait and mobility  Difficulty in walking, not elsewhere classified     Problem List Patient Active Problem List   Diagnosis Date Noted  . Sensorineural hearing loss, bilateral 06/28/2014  . Osteoporosis 02/21/2014  . Nephrolithiasis 11/02/2013  . Hyperlipidemia 03/07/2013  . Calculus of ureter 02/14/2013  . Essential hypertension 02/01/2013  . Gastro-esophageal reflux disease without esophagitis 08/01/2012  . Irritable bowel syndrome without diarrhea 08/01/2012  . Migraine, unspecified, not intractable, without status migrainosus 12/31/2011    JPercival Spanish PT, MPT 03/29/2018, 4:56 PM  [facility] [place] [address]Phone: [phone]  Fax:  [phone] Name: KBryttany TortorelliMRN: [id number] of Birth: [birth year]

## 2018-04-01 ENCOUNTER — Encounter: Payer: Self-pay | Admitting: Physical Therapy

## 2018-04-01 ENCOUNTER — Ambulatory Visit (INDEPENDENT_AMBULATORY_CARE_PROVIDER_SITE_OTHER): Admitting: Family Medicine

## 2018-04-01 ENCOUNTER — Ambulatory Visit: Admitting: Physical Therapy

## 2018-04-01 ENCOUNTER — Encounter: Payer: Self-pay | Admitting: Family Medicine

## 2018-04-01 VITALS — BP 150/75 | HR 64 | Ht <= 58 in | Wt 107.0 lb

## 2018-04-01 DIAGNOSIS — M25561 Pain in right knee: Secondary | ICD-10-CM

## 2018-04-01 DIAGNOSIS — S8991XD Unspecified injury of right lower leg, subsequent encounter: Secondary | ICD-10-CM | POA: Diagnosis not present

## 2018-04-01 DIAGNOSIS — R262 Difficulty in walking, not elsewhere classified: Secondary | ICD-10-CM

## 2018-04-01 DIAGNOSIS — R2689 Other abnormalities of gait and mobility: Secondary | ICD-10-CM

## 2018-04-01 DIAGNOSIS — M6281 Muscle weakness (generalized): Secondary | ICD-10-CM

## 2018-04-01 DIAGNOSIS — M25661 Stiffness of right knee, not elsewhere classified: Secondary | ICD-10-CM

## 2018-04-01 NOTE — Patient Instructions (Signed)
Take vitamin C 500mg  daily. Do the exercises only twice a day. Continue physical therapy, brace. Would consider MRI if not improving over the next month to 6 weeks. Follow up with me in that time frame.

## 2018-04-01 NOTE — Therapy (Signed)
[facility] [address]  [address] [place], [place], [address] Phone: [phone]   Fax:  [phone]  Physical Therapy Treatment  Patient Details  Name: Joan Horn MRN: [id number] Date of Birth: [birth year] Referring Provider (PT): Norton Blizzard, MD   Encounter Date: 04/01/2018  PT End of Session - 04/01/18 1058    Visit Number  9    Number of Visits  16    Date for PT Re-Evaluation  04/14/18    Authorization Type  [employer] - PT only    PT Start Time  1058    PT Stop Time  1203    PT Time Calculation (min)  65 min    Equipment Utilized During Treatment  Other (comment)   R knee hinged brace   Activity Tolerance  Patient tolerated treatment well    Behavior During Therapy  [facility] for tasks assessed/performed       Past Medical History:  Diagnosis Date  . GERD (gastroesophageal reflux disease)   . Hearing impaired   . Kidney stone     Past Surgical History:  Procedure Laterality Date  . ABDOMINAL HYSTERECTOMY      There were no vitals filed for this visit.  Subjective Assessment - 04/01/18 1101    Subjective  Pt reporting MD told her progress is a little slower than expected.    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    Patient Stated Goals  "to get off the crutches and walk"    Currently in Pain?  No/denies         [facility] PT Assessment - 04/01/18 1058      Assessment   Medical Diagnosis  R patellar fracture    Referring Provider (PT)  Norton Blizzard, MD    Onset Date/Surgical Date  01/14/18    Next MD Visit  04/29/18                   [facility] Adult PT Treatment/Exercise - 04/01/18 1058      Knee/Hip Exercises: Aerobic   Recumbent Bike  L1 x 6 min full revolutions (pillow behind back)      Knee/Hip Exercises: Standing    Hip Flexion  Right;10 reps;Knee straight;Stengthening    Hip Flexion Limitations  yellow TB at ankle, 1 pole A    Hip ADduction  Right;10 reps;Strengthening    Hip ADduction Limitations  yellow TB at ankle, 1 pole A    Hip Abduction  Right;10 reps;Knee straight;Stengthening    Abduction Limitations  yellow TB at ankle, 1 pole A    Hip Extension  Right;10 reps;Knee straight;Stengthening    Extension Limitations  yellow TB at ankle, 1 pole A    Lateral Step Up  Right;10 reps;Step Height: 4";Hand Hold: 1;Both;5 reps    Lateral Step Up Limitations  1 pole A    Forward Step Up  Right;10 reps;Step Height: 4";Hand Hold: 1    Forward Step Up Limitations  1 pole A - pt vaulting on first few reps, but control improving aas exercise progressed    SLS with Vectors  R SLS + L 4 way SLR x 10 each, 1 pole A    Other Standing Knee Exercises  R/L weight shift, heel-toe weight shift and marching in place on Airex pad x 20, 1 pole A  Knee/Hip Exercises: Seated   Other Seated Knee/Hip Exercises  R Fitter leg press (1 blue) x 20       Modalities   Modalities  Vasopneumatic      Vasopneumatic   Number Minutes Vasopneumatic   15 minutes   ended 4 min early d/t discomfort   Vasopnuematic Location   Knee   R   Vasopneumatic Pressure  Low    Vasopneumatic Temperature   coldest      Manual Therapy   Manual Therapy  Taping    Kinesiotex  Create Space      Kinesiotix   Edema  R chondromalacia patellae pattern- 50% stretch surrounding patella, 80% over patellar tendon             PT Education - 04/01/18 1157    Education Details  HEP update - standing 4 way SLR (yellow TB on R, no resistance on L - R SLS)    Person(s) Educated  Patient    Methods  Explanation;Demonstration;Handout    Comprehension  Verbalized understanding;Returned demonstration       PT Short Term Goals - 03/29/18 1501      PT SHORT TERM GOAL #1   Title  Independent with initial HEP    Status  Achieved      PT SHORT  TERM GOAL #2   Title  Patient will ambuate WBAT on R in hinged brace with single axillary crutch or LRAD     Status  Achieved      PT SHORT TERM GOAL #3   Title  R knee AROM >/= 5-90 degrees    Status  Achieved        PT Long Term Goals - 03/29/18 1502      PT LONG TERM GOAL #1   Title  Independent with ongoing/advanced HEP    Status  On-going      PT LONG TERM GOAL #2   Title  R knee AROM >/= 0-130 dg to allow for normal gait and stair negotiation    Status  On-going      PT LONG TERM GOAL #3   Title  R hip and knee strength >/= 4+/5 for improved stability    Status  On-going      PT LONG TERM GOAL #4   Title  Patient will ambuate with normal gait pattern w/o AD    Status  On-going      PT LONG TERM GOAL #5   Title  Patient will report ability to complete ADLs and light household chores w/o restriction due to R knee pain, LOM or weakness    Status  On-going            Plan - 04/01/18 1106    Clinical Impression Statement  Joan Horn reporting MD feels like she is progressing slower than expected and wants her to hold off on driving for 2 more weeks and she will f/u MD in 4 weeks with possible MRI as indicated. Worked on further weight bearing tolerance on R with weight shifting and SLS activities with faitgue noted but no increased pain. Encouraged pt to wean back down to 1 crutch and will plan to wean to [facility] or no AD as tolerated. Taping reapplied and session concluded with vasopeumatic compression to minimize post-exercise inflammation/edema.    Rehab Potential  Good    Clinical Impairments Affecting Rehab Potential  B sensorineural hearing loss - cochlear implant & pt reads lips    PT Treatment/Interventions  Patient/family education;Therapeutic  exercise;Therapeutic activities;Functional mobility training;Gait training;Stair training;DME Instruction;Neuromuscular re-education;Balance training;Manual techniques;Passive range of motion;Taping;Dry  needling;Cryotherapy;Vasopneumatic Device;Moist Heat;Electrical Stimulation;Iontophoresis 4mg /ml Dexamethasone;ADLs/Self Care Home Management    PT Next Visit Plan  Gently progress R knee ROM; Progressive R hip and knee strengthening as tolerated; Gait training continuing to wean AD as indicated; Manual therapy for ROM and muscle tension/TPs as indicated with taping as continued benefit noted; Modalties PRN for pain/edema    Consulted and Agree with Plan of Care  Patient       Patient will benefit from skilled therapeutic intervention in order to improve the following deficits and impairments:  Pain, Decreased range of motion, Impaired flexibility, Increased muscle spasms, Decreased strength, Difficulty walking, Abnormal gait, Decreased activity tolerance, Decreased balance, Decreased endurance, Postural dysfunction, Improper body mechanics  Visit Diagnosis: Acute pain of right knee  Stiffness of right knee, not elsewhere classified  Muscle weakness (generalized)  Other abnormalities of gait and mobility  Difficulty in walking, not elsewhere classified     Problem List Patient Active Problem List   Diagnosis Date Noted  . Sensorineural hearing loss, bilateral 06/28/2014  . Osteoporosis 02/21/2014  . Nephrolithiasis 11/02/2013  . Hyperlipidemia 03/07/2013  . Calculus of ureter 02/14/2013  . Essential hypertension 02/01/2013  . Gastro-esophageal reflux disease without esophagitis 08/01/2012  . Irritable bowel syndrome without diarrhea 08/01/2012  . Migraine, unspecified, not intractable, without status migrainosus 12/31/2011    Marry GuanJoAnne M , PT, MPT 04/01/2018, 12:37 PM  [facility] [address]  [address] [place], [place], [address] Phone: [phone]   Fax:  [phone]  Name: Joan Horn MRN: [id number] Date of Birth: 09/18/1960

## 2018-04-02 ENCOUNTER — Encounter: Payer: Self-pay | Admitting: Family Medicine

## 2018-04-02 NOTE — Progress Notes (Signed)
   CC: r knee patellar fracture  HPI: 02/01/18: R knee pain - report hx of fracture on 11/29, comes in today wearing brace. She was walking on a curb on black Friday and tripped striking directly on the R knee cap. She was in [place] and went to the ED - XR with patelllar fracture. She was placed in a knee immobilizer and given norco. She has been doing ok with pain, but more pain with when she accidentally bears weight (has crutches) or with sleep. No hx of trauma or knee surgery in the past.  Pain level 5/10 currently, sharp anterior knee.  No skin changes, numbness.  04/01/18: Patient reports she feels better - has good days and bad days. She's using 2 crutches still when out, 1 crutch at home. Doing physical therapy and home exercises - does home exercises 3 times a day. Sunday had a lot of pain anterior knee, sharp. Has been icing as well - had to take a pain pill on Sunday. Swelling at times. No new injuries.  SH, FH, Meds, Allergies reviewed.  Objective: BP (!) 150/75   Pulse 64   Ht 4\' 10"  (1.473 m)   Wt 107 lb (48.5 kg)   BMI 22.36 kg/m  Gen: NAD, comfortable in exam room  Right knee: No gross deformity, ecchymoses, swelling, effusion, erythema.  Very mild warmth. TTP patella, post patellar facets, anterior portion of medial and lateral joint lines. ROM 0 - 90 degrees, pain on resisted extension with 4/5 strength. Negative ant/post drawers. Negative valgus/varus testing. Negative lachmans. Negative mcmurrays, apleys, patellar apprehension. NV intact distally.  MSK [place]: Again no separation at prior fracture site anterior patella with knee flexion - minimal cortical irregularity here now.  Assessment and Plan: 1. Right patellar fracture - concerning given she is still needing use of crutches due to pain, difficulty in physical therapy and with home exercises.  Fracture healing by imaging and no evidence malunion/nonunion.  Discussed possibility of CRPS but no clinical  findings for this except very mild warmth and persistent pain - will start vitamin C 500mg  daily.  Continue with knee brace, PT.  HEP only twice a day.  Consider MRI if not improving over next 1 month to 6 weeks.

## 2018-04-05 ENCOUNTER — Encounter: Payer: Self-pay | Admitting: Physical Therapy

## 2018-04-05 ENCOUNTER — Ambulatory Visit: Admitting: Physical Therapy

## 2018-04-05 DIAGNOSIS — M25561 Pain in right knee: Secondary | ICD-10-CM

## 2018-04-05 DIAGNOSIS — M6281 Muscle weakness (generalized): Secondary | ICD-10-CM

## 2018-04-05 DIAGNOSIS — R2689 Other abnormalities of gait and mobility: Secondary | ICD-10-CM

## 2018-04-05 DIAGNOSIS — R262 Difficulty in walking, not elsewhere classified: Secondary | ICD-10-CM

## 2018-04-05 DIAGNOSIS — M25661 Stiffness of right knee, not elsewhere classified: Secondary | ICD-10-CM

## 2018-04-05 NOTE — Therapy (Signed)
[facility] [address]  [place], [place], [address] Phone: [phone]   Fax:  [phone]  Physical Therapy Treatment  Patient Details  Name: Joan Horn MRN: [id number] Date of Birth: Jun 24, 1960 Referring Provider (PT): Karlton Lemon, MD   Encounter Date: 04/05/2018  PT End of Session - 04/05/18 1451    Visit Number  10    Number of Visits  16    Date for PT Re-Evaluation  04/14/18    Authorization Type  [employer] - PT only    PT Start Time  1451    PT Stop Time  1546    PT Time Calculation (min)  55 min    Equipment Utilized During Treatment  Other (comment)   R knee hinged brace   Activity Tolerance  Patient tolerated treatment well    Behavior During Therapy  [facility] for tasks assessed/performed       Past Medical History:  Diagnosis Date  . GERD (gastroesophageal reflux disease)   . Hearing impaired   . Kidney stone     Past Surgical History:  Procedure Laterality Date  . ABDOMINAL HYSTERECTOMY      There were no vitals filed for this visit.  Subjective Assessment - 04/05/18 1457    Subjective  Pt reporting she has been walking around at home w/o crutches most of the time. Has shad some increased swelling but able to control with icing.    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    Patient Stated Goals  "to get off the crutches and walk"    Currently in Pain?  No/denies         [facility] PT Assessment - 04/05/18 1451      Assessment   Medical Diagnosis  R patellar fracture    Referring Provider (PT)  Karlton Lemon, MD    Onset Date/Surgical Date  01/14/18    Next MD Visit  04/29/18      Observation/Other Assessments   Focus on Therapeutic Outcomes (FOTO)   Knee - 44% (56% limitation)      AROM   Right Knee Extension  1     Right Knee Flexion  118                   [facility] Adult PT Treatment/Exercise - 04/05/18 0001      Knee/Hip Exercises: Standing   Wall Squat  10 reps;3 seconds    Wall Squat Limitations  mini-squat with PT guarding at knees      Knee/Hip Exercises: Seated   Other Seated Knee/Hip Exercises  R green TB leg press x 20      Modalities   Modalities  Vasopneumatic      Vasopneumatic   Number Minutes Vasopneumatic   10 minutes    Vasopnuematic Location   Knee   R   Vasopneumatic Pressure  Low    Vasopneumatic Temperature   coldest      Manual Therapy   Manual Therapy  Joint mobilization;Soft tissue mobilization;Myofascial release;Taping    Joint Mobilization  Gentle R knee grade II-III tibial-femoral A/P mobs for flexion ROM & femoral-tibial A/P mobs for knee extension; Very gentle R patellar mobilizations in all planes grade II to tolerance - very ttp at lateral superior patella    Soft tissue mobilization  STM to  distal R quads and mid/distal R ITB    Myofascial Release  manual TPR to R distal quads (VL) at patellae attachment; pin & stretch to R ITB    Kinesiotex  Create Space      Kinesiotix   Edema  R chondromalacia patellae pattern- 50% stretch surrounding patella, 80% over patellar tendon + 80% at superior patella               PT Short Term Goals - 03/29/18 1501      PT SHORT TERM GOAL #1   Title  Independent with initial HEP    Status  Achieved      PT SHORT TERM GOAL #2   Title  Patient will ambuate WBAT on R in hinged brace with single axillary crutch or LRAD     Status  Achieved      PT SHORT TERM GOAL #3   Title  R knee AROM >/= 5-90 degrees    Status  Achieved        PT Long Term Goals - 04/05/18 1530      PT LONG TERM GOAL #1   Title  Independent with ongoing/advanced HEP    Status  On-going      PT LONG TERM GOAL #2   Title  R knee AROM >/= 0-130 dg to allow for normal gait and stair negotiation    Status  On-going      PT  LONG TERM GOAL #3   Title  R hip and knee strength >/= 4+/5 for improved stability    Status  On-going      PT LONG TERM GOAL #4   Title  Patient will ambuate with normal gait pattern w/o AD    Status  On-going      PT LONG TERM GOAL #5   Title  Patient will report ability to complete ADLs and light household chores w/o restriction due to R knee pain, LOM or weakness    Status  On-going            Plan - 04/05/18 1500    Clinical Impression Statement  [name] reporting that she is walking around her home mostly w/o crutches at this point but continues to take both crutches when going out. Significant limp/antalgic gait noted when attempting ambulation w/o AD during therapy, therefore encouraged pt to continue with single axillary crutch or cane until she feels comfortable accepting full weight on R LE w/o sensation of knee instability. Pt denying R knee pain upon arrival to PT but very ttp at superior lateral pole of patella during attempted patellar mobs to increase flexion ROM - TP identified in distal quad/VL with radicular pain reported extending to ITB band insertion during manual TPR, therefore superior horizontal strip added with patellar kinesiotaping today. Release of TP allowing for improved R knee AROM to 1-118 dg. Overall pt is progressing well with PT with all STGs now met and pt demonstrating good potential for further improvement; however with ongoing dependence on AD for ambulation, restricted R knee ROM and R LE weakness, anticipate pt will most likely require recert at end of current POC.    Rehab Potential  Good    Clinical Impairments Affecting Rehab Potential  B sensorineural hearing loss - cochlear implant & pt reads lips    PT Treatment/Interventions  Patient/family education;Therapeutic exercise;Therapeutic activities;Functional mobility training;Gait training;Stair training;DME Instruction;Neuromuscular re-education;Balance training;Manual techniques;Passive range of  motion;Taping;Dry needling;Cryotherapy;Vasopneumatic Device;Moist Heat;Electrical Stimulation;Iontophoresis 5m/ml Dexamethasone;ADLs/Self Care Home Management  PT Next Visit Plan  Gently progress R knee ROM; Progressive R hip and knee strengthening as tolerated; Gait training continuing to wean AD as indicated; Manual therapy for ROM and muscle tension/TPs as indicated with taping as continued benefit noted; Modalties PRN for pain/edema    Consulted and Agree with Plan of Care  Patient       Patient will benefit from skilled therapeutic intervention in order to improve the following deficits and impairments:  Pain, Decreased range of motion, Impaired flexibility, Increased muscle spasms, Decreased strength, Difficulty walking, Abnormal gait, Decreased activity tolerance, Decreased balance, Decreased endurance, Postural dysfunction, Improper body mechanics  Visit Diagnosis: Acute pain of right knee  Stiffness of right knee, not elsewhere classified  Muscle weakness (generalized)  Other abnormalities of gait and mobility  Difficulty in walking, not elsewhere classified     Problem List Patient Active Problem List   Diagnosis Date Noted  . Sensorineural hearing loss, bilateral 06/28/2014  . Osteoporosis 02/21/2014  . Nephrolithiasis 11/02/2013  . Hyperlipidemia 03/07/2013  . Calculus of ureter 02/14/2013  . Essential hypertension 02/01/2013  . Gastro-esophageal reflux disease without esophagitis 08/01/2012  . Irritable bowel syndrome without diarrhea 08/01/2012  . Migraine, unspecified, not intractable, without status migrainosus 12/31/2011    Percival Spanish, PT, MPT 04/05/2018, 7:42 PM  [facility] [address]  [place], [place], [address] Phone: [phone]   Fax:  [phone]  Name: Brazil Voytko MRN: [id number] Date of Birth: 1960/04/03

## 2018-04-08 ENCOUNTER — Ambulatory Visit: Admitting: Physical Therapy

## 2018-04-08 ENCOUNTER — Encounter: Payer: Self-pay | Admitting: Physical Therapy

## 2018-04-08 DIAGNOSIS — M6281 Muscle weakness (generalized): Secondary | ICD-10-CM

## 2018-04-08 DIAGNOSIS — M25661 Stiffness of right knee, not elsewhere classified: Secondary | ICD-10-CM

## 2018-04-08 DIAGNOSIS — R262 Difficulty in walking, not elsewhere classified: Secondary | ICD-10-CM

## 2018-04-08 DIAGNOSIS — R2689 Other abnormalities of gait and mobility: Secondary | ICD-10-CM

## 2018-04-08 DIAGNOSIS — M25561 Pain in right knee: Secondary | ICD-10-CM

## 2018-04-08 NOTE — Therapy (Signed)
[facility] [address]  [address] [place], [place], [address] Phone: [phone]   Fax:  [phone]  Physical Therapy Treatment  Patient Details  Name: Joan Horn MRN: [id number] Date of Birth: November 09, 1960 Referring Provider (PT): Norton Blizzard, MD   Encounter Date: 04/08/2018  PT End of Session - 04/08/18 1059    Visit Number  11    Number of Visits  16    Date for PT Re-Evaluation  04/14/18    Authorization Type  [employer] - PT only    PT Start Time  1059    PT Stop Time  1143    PT Time Calculation (min)  44 min    Equipment Utilized During Treatment  Other (comment)   R knee hinged brace   Activity Tolerance  Patient tolerated treatment well    Behavior During Therapy  [facility] for tasks assessed/performed       Past Medical History:  Diagnosis Date  . GERD (gastroesophageal reflux disease)   . Hearing impaired   . Kidney stone     Past Surgical History:  Procedure Laterality Date  . ABDOMINAL HYSTERECTOMY      There were no vitals filed for this visit.  Subjective Assessment - 04/08/18 1102    Subjective  Pt reporting she still feels like she is limping a little bit when she tries to walk w/o the crutches.    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    Patient Stated Goals  "to get off the crutches and walk"    Currently in Pain?  No/denies                       [facility] Adult PT Treatment/Exercise - 04/08/18 1059      Ambulation/Gait   Ambulation/Gait Assistance  5: Supervision    Ambulation/Gait Assistance Details  Cues for heel strike on weight acceptance and heel-toe progression during stance phase on R with increased hip and knee flexion during swing phase.    Ambulation Distance (Feet)  180 Feet    Assistive device   None    Gait Pattern  Step-through pattern;Decreased weight shift to right;Right flexed knee in stance;Decreased hip/knee flexion - right      Exercises   Exercises  Knee/Hip      Knee/Hip Exercises: Aerobic   Recumbent Bike  L1 x 6 min (pillow behind back)      Knee/Hip Exercises: Machines for Strengthening   Cybex Leg Press  B LE 10# x 15      Knee/Hip Exercises: Standing   Lateral Step Up  Right;15 reps;Step Height: 6";Hand Hold: 1    Lateral Step Up Limitations  intermittent 1 pole A    Forward Step Up  Right;15 reps;Step Height: 6";Hand Hold: 1    Forward Step Up Limitations  intermittent 1 pole A - pt vaulting on first few reps, but control improving with cues for TKE upon lift up with R LE    SLS  R/L SLS + opp foot ring pick-up and place on post    SLS with Vectors  R SLS + 4 way tap x 5 to 4 way colored discs, x 5 to balance pebbles, x 5 to top of cones      Knee/Hip Exercises: Seated   Other Seated Knee/Hip  Exercises  R green TB leg press x 20             PT Education - 04/08/18 1142    Education Details  Green TB provided for seated leg press at home    Person(s) Educated  Patient    Methods  Explanation;Demonstration    Comprehension  Verbalized understanding;Returned demonstration       PT Short Term Goals - 03/29/18 1501      PT SHORT TERM GOAL #1   Title  Independent with initial HEP    Status  Achieved      PT SHORT TERM GOAL #2   Title  Patient will ambuate WBAT on R in hinged brace with single axillary crutch or LRAD     Status  Achieved      PT SHORT TERM GOAL #3   Title  R knee AROM >/= 5-90 degrees    Status  Achieved        PT Long Term Goals - 04/05/18 1530      PT LONG TERM GOAL #1   Title  Independent with ongoing/advanced HEP    Status  On-going      PT LONG TERM GOAL #2   Title  R knee AROM >/= 0-130 dg to allow for normal gait and stair negotiation    Status  On-going      PT LONG TERM GOAL #3   Title  R hip and knee  strength >/= 4+/5 for improved stability    Status  On-going      PT LONG TERM GOAL #4   Title  Patient will ambuate with normal gait pattern w/o AD    Status  On-going      PT LONG TERM GOAL #5   Title  Patient will report ability to complete ADLs and light household chores w/o restriction due to R knee pain, LOM or weakness    Status  On-going            Plan - 04/08/18 1105    Clinical Impression Statement  Yatzary reporting continued attempts to ambulate w/o AD at home with ongoing limp - gait training for heel stirke and heel-toe progression resulting in more normalization of gait pattern with decreased limp. Increased emphasis on weight shift to R LE and R SLS stability as well as continued R LE strengthening emphasizing full knee extension on R - good tolerance other than expeceted fatigue.    Rehab Potential  Good    Clinical Impairments Affecting Rehab Potential  B sensorineural hearing loss - cochlear implant & pt reads lips    PT Treatment/Interventions  Patient/family education;Therapeutic exercise;Therapeutic activities;Functional mobility training;Gait training;Stair training;DME Instruction;Neuromuscular re-education;Balance training;Manual techniques;Passive range of motion;Taping;Dry needling;Cryotherapy;Vasopneumatic Device;Moist Heat;Electrical Stimulation;Iontophoresis 4mg /ml Dexamethasone;ADLs/Self Care Home Management    PT Next Visit Plan  Gently progress R knee ROM; Progressive R hip and knee strengthening as tolerated; Gait training continuing to wean AD as indicated; Manual therapy for ROM and muscle tension/TPs as indicated with taping as continued benefit noted; Modalties PRN for pain/edema    Consulted and Agree with Plan of Care  Patient       Patient will benefit from skilled therapeutic intervention in order to improve the following deficits and impairments:  Pain, Decreased range of motion, Impaired flexibility, Increased muscle spasms, Decreased strength,  Difficulty walking, Abnormal gait, Decreased activity tolerance, Decreased balance, Decreased endurance, Postural dysfunction, Improper body mechanics  Visit Diagnosis: Acute pain of right knee  Stiffness of  right knee, not elsewhere classified  Muscle weakness (generalized)  Other abnormalities of gait and mobility  Difficulty in walking, not elsewhere classified     Problem List Patient Active Problem List   Diagnosis Date Noted  . Sensorineural hearing loss, bilateral 06/28/2014  . Osteoporosis 02/21/2014  . Nephrolithiasis 11/02/2013  . Hyperlipidemia 03/07/2013  . Calculus of ureter 02/14/2013  . Essential hypertension 02/01/2013  . Gastro-esophageal reflux disease without esophagitis 08/01/2012  . Irritable bowel syndrome without diarrhea 08/01/2012  . Migraine, unspecified, not intractable, without status migrainosus 12/31/2011    Marry GuanJoAnne M Kreis, PT, MPT 04/08/2018, 11:58 AM  [facility] [address]  [address] [place], [place], [address] Phone: [phone]   Fax:  [phone]  Name: Delia ChimesKerstin Zeitz MRN: [id number] Date of Birth: 10/09/1960

## 2018-04-12 ENCOUNTER — Ambulatory Visit: Admitting: Physical Therapy

## 2018-04-12 ENCOUNTER — Encounter: Payer: Self-pay | Admitting: Physical Therapy

## 2018-04-12 DIAGNOSIS — M25561 Pain in right knee: Secondary | ICD-10-CM | POA: Diagnosis not present

## 2018-04-12 DIAGNOSIS — R262 Difficulty in walking, not elsewhere classified: Secondary | ICD-10-CM

## 2018-04-12 DIAGNOSIS — R2689 Other abnormalities of gait and mobility: Secondary | ICD-10-CM

## 2018-04-12 DIAGNOSIS — M25661 Stiffness of right knee, not elsewhere classified: Secondary | ICD-10-CM

## 2018-04-12 DIAGNOSIS — M6281 Muscle weakness (generalized): Secondary | ICD-10-CM

## 2018-04-12 NOTE — Therapy (Signed)
[facility] [address]  [place], [place], [address] Phone: [phone]   Fax:  [phone]  Physical Therapy Treatment  Patient Details  Name: Joan Horn MRN: [id number] Date of Birth: 16-Jun-1960 Referring Provider (PT): Karlton Lemon, MD   Encounter Date: 04/12/2018  PT End of Session - 04/12/18 1448    Visit Number  12    Number of Visits  24    Date for PT Re-Evaluation  05/24/18    Authorization Type  [employer] - PT only    PT Start Time  1448    PT Stop Time  1538    PT Time Calculation (min)  50 min    Equipment Utilized During Treatment  Other (comment)   R knee hinged brace   Activity Tolerance  Patient tolerated treatment well    Behavior During Therapy  [facility] for tasks assessed/performed       Past Medical History:  Diagnosis Date  . GERD (gastroesophageal reflux disease)   . Hearing impaired   . Kidney stone     Past Surgical History:  Procedure Laterality Date  . ABDOMINAL HYSTERECTOMY      There were no vitals filed for this visit.  Subjective Assessment - 04/12/18 1452    Subjective  Pt reporting her knee has been swelling since Sunday, making it more painful and harder to walk on.    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    Patient Stated Goals  "to get off the crutches and walk"    Currently in Pain?  Yes    Pain Score  3     Pain Location  Knee    Pain Orientation  Right    Pain Descriptors / Indicators  Throbbing   "swollen"   Pain Type  Acute pain    Pain Frequency  Intermittent         [facility] PT Assessment - 04/12/18 1448      Assessment   Medical Diagnosis  R patellar fracture    Referring Provider (PT)  Karlton Lemon, MD    Onset Date/Surgical Date  01/14/18    Next MD Visit  04/29/18      AROM    Right Knee Extension  0    Right Knee Flexion  121      Strength   Right Hip Flexion  4/5    Right Hip Extension  4/5    Right Hip External Rotation   4/5    Right Hip Internal Rotation  4+/5    Right Hip ABduction  4+/5    Right Hip ADduction  4/5    Left Hip Flexion  4+/5    Left Hip Extension  4/5    Left Hip External Rotation  4/5    Left Hip Internal Rotation  4/5    Left Hip ABduction  4+/5    Left Hip ADduction  4+/5    Right Knee Flexion  4-/5    Right Knee Extension  3+/5    Left Knee Flexion  5/5    Left Knee Extension  5/5                   [facility] Adult PT Treatment/Exercise - 04/12/18 1448      Exercises   Exercises  Knee/Hip  Knee/Hip Exercises: Aerobic   Recumbent Bike  L1 x 6 min (pillow behind back)      Knee/Hip Exercises: Standing   Hip Flexion  Right;10 reps;Knee straight;Stengthening    Hip Flexion Limitations  red TB at ankle, UE support on back of chair    Hip ADduction  Right;10 reps;Strengthening    Hip ADduction Limitations  red TB at ankle, UE support on back of chair    Hip Abduction  Right;10 reps;Knee straight;Stengthening    Abduction Limitations  red TB at ankle, UE support on back of chair    Hip Extension  Right;10 reps;Knee straight;Stengthening    Extension Limitations  red TB at ankle, UE support on back of chair      Knee/Hip Exercises: Supine   Straight Leg Raise with External Rotation  Right;10 reps;2 sets;Strengthening      Manual Therapy   Manual Therapy  Joint mobilization;Soft tissue mobilization;Myofascial release;Taping    Manual therapy comments  supine & hooklying    Joint Mobilization  Gentle R knee grade II-III tibial-femoral A/P mobs for flexion ROM & femoral-tibial A/P mobs for knee extension; Very gentle R patellar mobilizations in all planes grade II to tolerance - very ttp at lateral superior patella    Kinesiotex  Create Space               PT Short Term Goals - 03/29/18 1501      PT  SHORT TERM GOAL #1   Title  Independent with initial HEP    Status  Achieved      PT SHORT TERM GOAL #2   Title  Patient will ambuate WBAT on R in hinged brace with single axillary crutch or LRAD     Status  Achieved      PT SHORT TERM GOAL #3   Title  R knee AROM >/= 5-90 degrees    Status  Achieved        PT Long Term Goals - 04/12/18 1521      PT LONG TERM GOAL #1   Title  Independent with ongoing/advanced HEP    Status  On-going    Target Date  05/24/18      PT LONG TERM GOAL #2   Title  R knee AROM >/= 0-130 dg to allow for normal gait and stair negotiation    Status  Partially Met    Target Date  05/24/18      PT LONG TERM GOAL #3   Title  R hip and knee strength >/= 4+/5 for improved stability    Status  Partially Met    Target Date  05/24/18      PT LONG TERM GOAL #4   Title  Patient will ambuate with normal gait pattern w/o AD    Status  On-going    Target Date  05/24/18      PT LONG TERM GOAL #5   Title  Patient will report ability to complete ADLs and light household chores w/o restriction due to R knee pain, LOM or weakness    Status  On-going    Target Date  05/24/18            Plan - 04/12/18 1530    Clinical Impression Statement  Shaana reporting increased pain and swelling in R knee and distal LE since Sunday, having been walking around w/o crutches full time at home since last week. Swelling at knee mostly down by start of PT as pt iced at home  before coming to PT but raised/swollen area over mid R anterior tibialis persists with ttp indicative of probable TP - addressed with STM and manual TPR with some relief. R knee ROM initially more limited today as a result of pain and swelling but able to increase AROM to 0-118 dg after manual therapy including patellar and joint mobs. Good tolerance for strengthening progression with addition of weights and advancement of theraband resistance from yellow to red - R hip strength now becoming more symmetrical  to L but ongoing mild B hip weakness and moderate R knee weakness continues to limit function. STGs met but LTGs ongoing with only 2 goals partially met at this time. Given benefit from PT with ongoing deficits, will recommend recert at 2x/wk for up to additional 4-6 weeks to restore full R knee AROM and functional B LE strength to allow for normal gait and functional mobility w/o need for knee brace or AD.    Rehab Potential  Good    Clinical Impairments Affecting Rehab Potential  B sensorineural hearing loss - cochlear implant & pt reads lips    PT Treatment/Interventions  Patient/family education;Therapeutic exercise;Therapeutic activities;Functional mobility training;Gait training;Stair training;DME Instruction;Neuromuscular re-education;Balance training;Manual techniques;Passive range of motion;Taping;Dry needling;Cryotherapy;Vasopneumatic Device;Moist Heat;Electrical Stimulation;Iontophoresis 30m/ml Dexamethasone;ADLs/Self Care Home Management    PT Next Visit Plan  Gently progress R knee ROM; Progressive R hip and knee strengthening as tolerated; Gait training continuing to wean AD and brace as indicated; Manual therapy for ROM and muscle tension/TPs as indicated with taping as continued benefit noted; Modalties PRN for pain/edema    Consulted and Agree with Plan of Care  Patient       Patient will benefit from skilled therapeutic intervention in order to improve the following deficits and impairments:  Pain, Decreased range of motion, Impaired flexibility, Increased muscle spasms, Decreased strength, Difficulty walking, Abnormal gait, Decreased activity tolerance, Decreased balance, Decreased endurance, Postural dysfunction, Improper body mechanics  Visit Diagnosis: Acute pain of right knee  Stiffness of right knee, not elsewhere classified  Muscle weakness (generalized)  Other abnormalities of gait and mobility  Difficulty in walking, not elsewhere classified     Problem  List Patient Active Problem List   Diagnosis Date Noted  . Sensorineural hearing loss, bilateral 06/28/2014  . Osteoporosis 02/21/2014  . Nephrolithiasis 11/02/2013  . Hyperlipidemia 03/07/2013  . Calculus of ureter 02/14/2013  . Essential hypertension 02/01/2013  . Gastro-esophageal reflux disease without esophagitis 08/01/2012  . Irritable bowel syndrome without diarrhea 08/01/2012  . Migraine, unspecified, not intractable, without status migrainosus 12/31/2011    JPercival Spanish PT, MPT 04/12/2018, 6:17 PM  [facility] [place] [address]Phone: [phone]  Fax:  [phone] Name: KBrittanny LevenhagenMRN: [id number] of Birth: [birth year]

## 2018-04-15 ENCOUNTER — Encounter: Payer: Self-pay | Admitting: Physical Therapy

## 2018-04-15 ENCOUNTER — Ambulatory Visit: Admitting: Physical Therapy

## 2018-04-15 DIAGNOSIS — M25561 Pain in right knee: Secondary | ICD-10-CM | POA: Diagnosis not present

## 2018-04-15 DIAGNOSIS — M6281 Muscle weakness (generalized): Secondary | ICD-10-CM

## 2018-04-15 DIAGNOSIS — R262 Difficulty in walking, not elsewhere classified: Secondary | ICD-10-CM

## 2018-04-15 DIAGNOSIS — R2689 Other abnormalities of gait and mobility: Secondary | ICD-10-CM

## 2018-04-15 DIAGNOSIS — M25661 Stiffness of right knee, not elsewhere classified: Secondary | ICD-10-CM

## 2018-04-15 NOTE — Therapy (Signed)
[facility] [address]  [place], [place], [address] Phone: [phone]   Fax:  [phone]  Physical Therapy Treatment  Patient Details  Name: Joan Horn MRN: [id number] Date of Birth: 1960/10/16 Referring Provider (PT): Joan Lemon, MD   Encounter Date: 04/15/2018  PT End of Session - 04/15/18 1106    Visit Number  13    Number of Visits  24    Date for PT Re-Evaluation  05/24/18    Authorization Type  [employer] - PT only    PT Start Time  1106    PT Stop Time  1202    PT Time Calculation (min)  56 min    Equipment Utilized During Treatment  Other (comment)   R knee hinged brace   Activity Tolerance  Patient tolerated treatment well    Behavior During Therapy  [facility] for tasks assessed/performed       Past Medical History:  Diagnosis Date  . GERD (gastroesophageal reflux disease)   . Hearing impaired   . Kidney stone     Past Surgical History:  Procedure Laterality Date  . ABDOMINAL HYSTERECTOMY      There were no vitals filed for this visit.  Subjective Assessment - 04/15/18 1110    Subjective  Pt reporting swelling has been better, but pain flared up yesterday when she had a family emeregency and forgot to bring her crutches with when she went to the hospital.    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    Patient Stated Goals  "to get off the crutches and walk"    Currently in Pain?  No/denies                       [facility] Adult PT Treatment/Exercise - 04/15/18 1106      Exercises   Exercises  Knee/Hip      Knee/Hip Exercises: Stretches   Gastroc Stretch  Right;30 seconds;2 reps    Gastroc Stretch Limitations  Prostretch, over edge of step & leaning into wall    Soleus Stretch  Right;30 seconds;2 reps    Soleus Stretch Limitations  standing leaning in to wall      Knee/Hip Exercises: Aerobic   Recumbent Bike  L1 x 6 min (pillow behind back)      Knee/Hip Exercises: Standing   Side Lunges  Right;Left;10 reps;3 seconds    Side Lunges Limitations  TRX - cues for neutral foot alignment with hip and knee, avoiding hip ER and knees forward of toes    Step Down  Right;10 reps;Step Height: 4";Hand Hold: 2    Step Down Limitations  cues for level pelvis    Functional Squat  15 reps;3 seconds    Functional Squat Limitations  TRX - cues for even weight shift and centered hips as well as avoiding knees past toes    SLS with Vectors  R SLS on blue foam oval with 3 way toe tap to balance pebbles x10      Modalities   Modalities  Vasopneumatic      Vasopneumatic   Number Minutes Vasopneumatic   15 minutes    Vasopnuematic Location   Knee   R   Vasopneumatic Pressure  Low    Vasopneumatic Temperature   coldest  PT Education - 04/15/18 1152    Education Details  HEP update - standing calf stretches, standing clocks    Person(s) Educated  Patient    Methods  Explanation;Demonstration;Handout    Comprehension  Verbalized understanding;Returned demonstration;Need further instruction       PT Short Term Goals - 03/29/18 1501      PT SHORT TERM GOAL #1   Title  Independent with initial HEP    Status  Achieved      PT SHORT TERM GOAL #2   Title  Patient will ambuate WBAT on R in hinged brace with single axillary crutch or LRAD     Status  Achieved      PT SHORT TERM GOAL #3   Title  R knee AROM >/= 5-90 degrees    Status  Achieved        PT Long Term Goals - 04/12/18 1521      PT LONG TERM GOAL #1   Title  Independent with ongoing/advanced HEP    Status  On-going    Target Date  05/24/18      PT LONG TERM GOAL #2   Title  R knee AROM >/= 0-130 dg to allow for normal gait and stair negotiation    Status  Partially Met    Target Date  05/24/18      PT LONG TERM  GOAL #3   Title  R hip and knee strength >/= 4+/5 for improved stability    Status  Partially Met    Target Date  05/24/18      PT LONG TERM GOAL #4   Title  Patient will ambuate with normal gait pattern w/o AD    Status  On-going    Target Date  05/24/18      PT LONG TERM GOAL #5   Title  Patient will report ability to complete ADLs and light household chores w/o restriction due to R knee pain, LOM or weakness    Status  On-going    Target Date  05/24/18            Plan - 04/15/18 1115    Clinical Impression Statement  Lizzet noting improvement in swelling but flare-up of pain after going out for a family emergency yesterday w/o her crutches. Pain resolved today. Less stiffness noted during warm-up with pt able to acheive full revolutions on bike from the start. Treatment session focusing on increased closed chain strengthening and proprioceptive training with SLS stance on R - pt remains reluctant to shift wieght to R LE due to weakness but denies pain.    Rehab Potential  Good    Clinical Impairments Affecting Rehab Potential  B sensorineural hearing loss - cochlear implant & pt reads lips    PT Treatment/Interventions  Patient/family education;Therapeutic exercise;Therapeutic activities;Functional mobility training;Gait training;Stair training;DME Instruction;Neuromuscular re-education;Balance training;Manual techniques;Passive range of motion;Taping;Dry needling;Cryotherapy;Vasopneumatic Device;Moist Heat;Electrical Stimulation;Iontophoresis 54m/ml Dexamethasone;ADLs/Self Care Home Management    PT Next Visit Plan  Gently progress R knee ROM; Progressive R hip and knee strengthening as tolerated; Gait training continuing to wean AD and brace as indicated; Manual therapy for ROM and muscle tension/TPs as indicated with taping as continued benefit noted; Modalties PRN for pain/edema    Consulted and Agree with Plan of Care  Patient       Patient will benefit from skilled  therapeutic intervention in order to improve the following deficits and impairments:  Pain, Decreased range of motion, Impaired flexibility, Increased muscle spasms, Decreased strength,  Difficulty walking, Abnormal gait, Decreased activity tolerance, Decreased balance, Decreased endurance, Postural dysfunction, Improper body mechanics  Visit Diagnosis: Acute pain of right knee  Stiffness of right knee, not elsewhere classified  Muscle weakness (generalized)  Other abnormalities of gait and mobility  Difficulty in walking, not elsewhere classified     Problem List Patient Active Problem List   Diagnosis Date Noted  . Sensorineural hearing loss, bilateral 06/28/2014  . Osteoporosis 02/21/2014  . Nephrolithiasis 11/02/2013  . Hyperlipidemia 03/07/2013  . Calculus of ureter 02/14/2013  . Essential hypertension 02/01/2013  . Gastro-esophageal reflux disease without esophagitis 08/01/2012  . Irritable bowel syndrome without diarrhea 08/01/2012  . Migraine, unspecified, not intractable, without status migrainosus 12/31/2011    Percival Spanish, PT, MPT 04/15/2018, 12:17 PM  [facility] [address]  [place], [place], [address] Phone: [phone]   Fax:  [phone]  Name: Joan Horn MRN: [id number] Date of Birth: 1961/01/16

## 2018-04-19 ENCOUNTER — Ambulatory Visit: Attending: Family Medicine | Admitting: Physical Therapy

## 2018-04-19 ENCOUNTER — Encounter: Payer: Self-pay | Admitting: Physical Therapy

## 2018-04-19 DIAGNOSIS — M6281 Muscle weakness (generalized): Secondary | ICD-10-CM | POA: Diagnosis present

## 2018-04-19 DIAGNOSIS — M25661 Stiffness of right knee, not elsewhere classified: Secondary | ICD-10-CM | POA: Diagnosis present

## 2018-04-19 DIAGNOSIS — R262 Difficulty in walking, not elsewhere classified: Secondary | ICD-10-CM | POA: Insufficient documentation

## 2018-04-19 DIAGNOSIS — R2689 Other abnormalities of gait and mobility: Secondary | ICD-10-CM | POA: Insufficient documentation

## 2018-04-19 DIAGNOSIS — M25561 Pain in right knee: Secondary | ICD-10-CM | POA: Diagnosis present

## 2018-04-19 NOTE — Therapy (Signed)
[facility] [address]  [place], [place], [address] Phone: [phone]   Fax:  [phone]  Physical Therapy Treatment  Patient Details  Name: Joan Horn MRN: [id number] Date of Birth: 1960-07-26 Referring Provider (PT): Karlton Lemon, MD   Encounter Date: 04/19/2018  PT End of Session - 04/19/18 0929    Visit Number  14    Number of Visits  24    Date for PT Re-Evaluation  05/24/18    Authorization Type  [employer] - PT only    PT Start Time  0929    PT Stop Time  1026    PT Time Calculation (min)  57 min    Activity Tolerance  Patient tolerated treatment well    Behavior During Therapy  [facility] for tasks assessed/performed       Past Medical History:  Diagnosis Date  . GERD (gastroesophageal reflux disease)   . Hearing impaired   . Kidney stone     Past Surgical History:  Procedure Laterality Date  . ABDOMINAL HYSTERECTOMY      There were no vitals filed for this visit.  Subjective Assessment - 04/19/18 0931    Subjective  Pt reporting some increased pain over the weekend because she was on her feet alot but no sweliing and pain has now subsided.    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    Patient Stated Goals  "to get off the crutches and walk"    Currently in Pain?  No/denies                       [facility] Adult PT Treatment/Exercise - 04/19/18 0929      Exercises   Exercises  Knee/Hip      Knee/Hip Exercises: Aerobic   Recumbent Bike  L2 x 6 min (pillow behind back)      Knee/Hip Exercises: Machines for Strengthening   Cybex Leg Press  B LE 15# x15; B con/R ecc 10# x 10      Knee/Hip Exercises: Standing   Terminal Knee Extension  Right;15 reps    Theraband Level (Terminal Knee Extension)  Level 4 (Blue)     Terminal Knee Extension Limitations  cues for quad & glute set while pressing heel into floor    Hip Abduction  Right;Left;10 reps;Knee bent;Stengthening    Abduction Limitations  Fitter (2 blue bands) - stationary foot on floor    Hip Extension  Right;Left;10 reps;Knee bent;Stengthening    Extension Limitations  Fitter (2 blue bands) - stationary foot on floor    Rocker Board  4 minutes    Rocker Board Limitations  Inverted BOSU - static balance, lateral weight shift, heel-toe weight shift & mini-squat x ~1 minute each - initially with B HHA porgressing to SBA with each activity    SLS  R SLS 3 x15" - 2 sets (1st set on firm surface, 2nd set on blue foam oval) - 1 pole A      Modalities   Modalities  Vasopneumatic      Vasopneumatic   Number Minutes Vasopneumatic   10 minutes    Vasopnuematic Location   Knee   R   Vasopneumatic Pressure  Low    Vasopneumatic Temperature   coldest  PT Education - 04/19/18 1015    Education Details  HEP addition - blue TB TKE; further crutch & brace weaning    Person(s) Educated  Patient    Methods  Explanation;Demonstration;Handout    Comprehension  Verbalized understanding;Returned demonstration;Need further instruction       PT Short Term Goals - 03/29/18 1501      PT SHORT TERM GOAL #1   Title  Independent with initial HEP    Status  Achieved      PT SHORT TERM GOAL #2   Title  Patient will ambuate WBAT on R in hinged brace with single axillary crutch or LRAD     Status  Achieved      PT SHORT TERM GOAL #3   Title  R knee AROM >/= 5-90 degrees    Status  Achieved        PT Long Term Goals - 04/12/18 1521      PT LONG TERM GOAL #1   Title  Independent with ongoing/advanced HEP    Status  On-going    Target Date  05/24/18      PT LONG TERM GOAL #2   Title  R knee AROM >/= 0-130 dg to allow for normal gait and stair negotiation    Status  Partially Met    Target Date  05/24/18      PT LONG TERM GOAL #3    Title  R hip and knee strength >/= 4+/5 for improved stability    Status  Partially Met    Target Date  05/24/18      PT LONG TERM GOAL #4   Title  Patient will ambuate with normal gait pattern w/o AD    Status  On-going    Target Date  05/24/18      PT LONG TERM GOAL #5   Title  Patient will report ability to complete ADLs and light household chores w/o restriction due to R knee pain, LOM or weakness    Status  On-going    Target Date  05/24/18            Plan - 04/19/18 0933    Clinical Impression Statement  Kerstim still experiencing increased pain when more active and on her feet more but no recent issues with increased swelling and noting improving ability to bend her knee with decreased sitffness. Continued emphasis on dynamic strengthening to incorporate more balance and proprioceptive training as well as isolated R LE strengthening and stability avoiding compensation from L LE - increased fatigue noted but no pain reported. Discussed continued weaning of crutches to single crutch for community access unless walking for long periods/distances as well as weaning brace for brief periods in home.    Rehab Potential  Good    Clinical Impairments Affecting Rehab Potential  B sensorineural hearing loss - cochlear implant & pt reads lips    PT Treatment/Interventions  Patient/family education;Therapeutic exercise;Therapeutic activities;Functional mobility training;Gait training;Stair training;DME Instruction;Neuromuscular re-education;Balance training;Manual techniques;Passive range of motion;Taping;Dry needling;Cryotherapy;Vasopneumatic Device;Moist Heat;Electrical Stimulation;Iontophoresis 80m/ml Dexamethasone;ADLs/Self Care Home Management    PT Next Visit Plan  Gently progress R knee ROM; Progressive R hip and knee strengthening as tolerated; Gait training continuing to wean AD and brace as indicated; Manual therapy for ROM and muscle tension/TPs as indicated with taping as continued  benefit noted; Modalties PRN for pain/edema    Consulted and Agree with Plan of Care  Patient       Patient will benefit from skilled therapeutic intervention  in order to improve the following deficits and impairments:  Pain, Decreased range of motion, Impaired flexibility, Increased muscle spasms, Decreased strength, Difficulty walking, Abnormal gait, Decreased activity tolerance, Decreased balance, Decreased endurance, Postural dysfunction, Improper body mechanics  Visit Diagnosis: Acute pain of right knee  Stiffness of right knee, not elsewhere classified  Muscle weakness (generalized)  Other abnormalities of gait and mobility  Difficulty in walking, not elsewhere classified     Problem List Patient Active Problem List   Diagnosis Date Noted  . Sensorineural hearing loss, bilateral 06/28/2014  . Osteoporosis 02/21/2014  . Nephrolithiasis 11/02/2013  . Hyperlipidemia 03/07/2013  . Calculus of ureter 02/14/2013  . Essential hypertension 02/01/2013  . Gastro-esophageal reflux disease without esophagitis 08/01/2012  . Irritable bowel syndrome without diarrhea 08/01/2012  . Migraine, unspecified, not intractable, without status migrainosus 12/31/2011    Percival Spanish, PT, MPT 04/19/2018, 10:39 AM  [facility] [address]  [place], [place], [address] Phone: [phone]   Fax:  [phone]  Name: Kasidi Shanker MRN: [id number] Date of Birth: 1960-08-10

## 2018-04-22 ENCOUNTER — Encounter: Payer: Self-pay | Admitting: Physical Therapy

## 2018-04-22 ENCOUNTER — Ambulatory Visit: Admitting: Physical Therapy

## 2018-04-22 DIAGNOSIS — M25661 Stiffness of right knee, not elsewhere classified: Secondary | ICD-10-CM

## 2018-04-22 DIAGNOSIS — M25561 Pain in right knee: Secondary | ICD-10-CM

## 2018-04-22 DIAGNOSIS — R262 Difficulty in walking, not elsewhere classified: Secondary | ICD-10-CM

## 2018-04-22 DIAGNOSIS — R2689 Other abnormalities of gait and mobility: Secondary | ICD-10-CM

## 2018-04-22 DIAGNOSIS — M6281 Muscle weakness (generalized): Secondary | ICD-10-CM

## 2018-04-22 NOTE — Therapy (Signed)
[facility] [address]  [place], [place], [address] Phone: [phone]   Fax:  [phone]  Physical Therapy Treatment  Patient Details  Name: Joan Horn MRN: [id number] Date of Birth: January 28, 1961 Referring Provider (PT): Karlton Lemon, MD   Encounter Date: 04/22/2018  PT End of Session - 04/22/18 0930    Visit Number  15    Number of Visits  24    Date for PT Re-Evaluation  05/24/18    Authorization Type  [employer] - PT only    PT Start Time  0930    PT Stop Time  1016    PT Time Calculation (min)  46 min    Activity Tolerance  Patient tolerated treatment well    Behavior During Therapy  [facility] for tasks assessed/performed       Past Medical History:  Diagnosis Date  . GERD (gastroesophageal reflux disease)   . Hearing impaired   . Kidney stone     Past Surgical History:  Procedure Laterality Date  . ABDOMINAL HYSTERECTOMY      There were no vitals filed for this visit.  Subjective Assessment - 04/22/18 0949    Subjective  Pt reporting she has been walking around at home intermittently w/o brace w/o any issues and today is the first time she ventured out of her home with only 1 crutch.    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    Patient Stated Goals  "to get off the crutches and walk"    Currently in Pain?  No/denies                       [facility] Adult PT Treatment/Exercise - 04/22/18 0930      Ambulation/Gait   Ambulation/Gait Assistance  5: Supervision    Ambulation/Gait Assistance Details  Cues for proper sequencing of cane as well as increased hip and knee flexion with heel strike on weight acceptance.    Ambulation Distance (Feet)  180 Feet    Assistive device  Straight cane    Gait Pattern  Step-through  pattern;Decreased weight shift to right;Right flexed knee in stance;Decreased hip/knee flexion - right    Stairs Assistance  5: Supervision;4: Min guard    Stairs Assistance Details (indicate cue type and reason)  Instructed pt in stair negotiation with rail & single crutch, with rail only and with crutch only to increase pt comfort with community navigation of stairs. Pt unable to safely perform reciprocal step pattern, therefore all attempts with step-to pattern.    Stair Management Technique  One rail Right;Step to pattern;With crutches;No rails    Number of Stairs  14   x 3   Height of Stairs  7      Exercises   Exercises  Knee/Hip      Knee/Hip Exercises: Stretches   Designer, fashion/clothing Limitations  prone with strap     ITB Stretch  Right;30 seconds;2 reps    ITB Stretch Limitations  supine with strap      Knee/Hip Exercises: Aerobic   Recumbent Bike  L2 x 2 min (pillow behind back) - stopped due lateral knee pain      Knee/Hip Exercises: Standing   Forward Step Up  Right;15 reps;Step Height: 4";Hand  Hold: 1    Forward Step Up Limitations  cane for support working on cane sequencing  to mimick stair/curb ascent    Wall Squat  10 reps;3 seconds    Wall Squat Limitations  + hip adduction ball squeeze; cues for symmetrical weight shift avoiding shifting weight to L      Manual Therapy   Manual Therapy  Soft tissue mobilization;Myofascial release;Taping    Manual therapy comments  supine    Soft tissue mobilization  STM to mid/distal R quads and ITB    Myofascial Release  manual TPR to R distal quads (VL) at patella attachment; pin & stretch to R ITB    Kinesiotex  Inhibit Muscle      Kinesiotix   Edema  R knee - 50% patellar tendon strip + "X" over tibial tuberosity with 30-50% tails extending to VMO and VL               PT Short Term Goals - 03/29/18 1501      PT SHORT TERM GOAL #1   Title  Independent with initial HEP    Status   Achieved      PT SHORT TERM GOAL #2   Title  Patient will ambuate WBAT on R in hinged brace with single axillary crutch or LRAD     Status  Achieved      PT SHORT TERM GOAL #3   Title  R knee AROM >/= 5-90 degrees    Status  Achieved        PT Long Term Goals - 04/12/18 1521      PT LONG TERM GOAL #1   Title  Independent with ongoing/advanced HEP    Status  On-going    Target Date  05/24/18      PT LONG TERM GOAL #2   Title  R knee AROM >/= 0-130 dg to allow for normal gait and stair negotiation    Status  Partially Met    Target Date  05/24/18      PT LONG TERM GOAL #3   Title  R hip and knee strength >/= 4+/5 for improved stability    Status  Partially Met    Target Date  05/24/18      PT LONG TERM GOAL #4   Title  Patient will ambuate with normal gait pattern w/o AD    Status  On-going    Target Date  05/24/18      PT LONG TERM GOAL #5   Title  Patient will report ability to complete ADLs and light household chores w/o restriction due to R knee pain, LOM or weakness    Status  On-going    Target Date  05/24/18            Plan - 04/22/18 1016    Clinical Impression Statement  Lyrica reporting increased grinding and superior-lateral R knee pain during warm-up on bike today. Taut bands/TPs identified in mid/distal R VL & ITB a few visits ago again present with significant ttp over mid/distal VL - some relief with STM and roller stick followed by stretching but limited tolerance for deep pressure - suggested trial of DN but pt reluctant due to fear of needles, therefore deferred. Taping applied to promote further muscle relaxation of R VL and facilitate increased VMO activation. Pt expressing concerns about weaning crutches with community ambulation due to lack of comfort with stair negotiation therefore provided instruction in stair negotiation with 1 rail & single crutch,  with rail only and with crutch only to increase pt comfort with community navigation of stairs.  Also provided instruction in gait training and stair/curb negotiation with SPC as alternative to single crutch.    Rehab Potential  Good    Clinical Impairments Affecting Rehab Potential  B sensorineural hearing loss - cochlear implant & pt reads lips    PT Treatment/Interventions  Patient/family education;Therapeutic exercise;Therapeutic activities;Functional mobility training;Gait training;Stair training;DME Instruction;Neuromuscular re-education;Balance training;Manual techniques;Passive range of motion;Taping;Dry needling;Cryotherapy;Vasopneumatic Device;Moist Heat;Electrical Stimulation;Iontophoresis 3m/ml Dexamethasone;ADLs/Self Care Home Management    PT Next Visit Plan  Gently progress R knee ROM; Progressive R hip and knee strengthening as tolerated; Gait training continuing to wean AD and brace as indicated; Manual therapy for ROM and muscle tension/TPs as indicated with taping as continued benefit noted; Modalties PRN for pain/edema    Consulted and Agree with Plan of Care  Patient       Patient will benefit from skilled therapeutic intervention in order to improve the following deficits and impairments:  Pain, Decreased range of motion, Impaired flexibility, Increased muscle spasms, Decreased strength, Difficulty walking, Abnormal gait, Decreased activity tolerance, Decreased balance, Decreased endurance, Postural dysfunction, Improper body mechanics  Visit Diagnosis: Acute pain of right knee  Stiffness of right knee, not elsewhere classified  Muscle weakness (generalized)  Other abnormalities of gait and mobility  Difficulty in walking, not elsewhere classified     Problem List Patient Active Problem List   Diagnosis Date Noted  . Sensorineural hearing loss, bilateral 06/28/2014  . Osteoporosis 02/21/2014  . Nephrolithiasis 11/02/2013  . Hyperlipidemia 03/07/2013  . Calculus of ureter 02/14/2013  . Essential hypertension 02/01/2013  . Gastro-esophageal reflux disease  without esophagitis 08/01/2012  . Irritable bowel syndrome without diarrhea 08/01/2012  . Migraine, unspecified, not intractable, without status migrainosus 12/31/2011    JPercival Spanish PT, MPT 04/22/2018, 1:38 PM  [facility] [place] [address]Phone: [phone]  Fax:  [phone] Name: KBonnita NewbyMRN: [id number] of Birth: [birth year]

## 2018-04-26 ENCOUNTER — Ambulatory Visit: Admitting: Physical Therapy

## 2018-04-29 ENCOUNTER — Ambulatory Visit: Admitting: Family Medicine

## 2018-04-29 ENCOUNTER — Ambulatory Visit: Admitting: Physical Therapy

## 2018-05-03 ENCOUNTER — Other Ambulatory Visit: Payer: Self-pay

## 2018-05-03 ENCOUNTER — Encounter: Payer: Self-pay | Admitting: Physical Therapy

## 2018-05-03 ENCOUNTER — Ambulatory Visit: Admitting: Physical Therapy

## 2018-05-03 DIAGNOSIS — M25661 Stiffness of right knee, not elsewhere classified: Secondary | ICD-10-CM

## 2018-05-03 DIAGNOSIS — M25561 Pain in right knee: Secondary | ICD-10-CM | POA: Diagnosis not present

## 2018-05-03 DIAGNOSIS — R2689 Other abnormalities of gait and mobility: Secondary | ICD-10-CM

## 2018-05-03 DIAGNOSIS — M6281 Muscle weakness (generalized): Secondary | ICD-10-CM

## 2018-05-03 DIAGNOSIS — R262 Difficulty in walking, not elsewhere classified: Secondary | ICD-10-CM

## 2018-05-03 NOTE — Therapy (Signed)
[facility] [address]  [place], [place], [address] Phone: [phone]   Fax:  [phone]  Physical Therapy Treatment  Patient Details  Name: Joan Horn MRN: [id number] Date of Birth: [birth year] Referring Provider (PT): Karlton Lemon, MD   Encounter Date: 05/03/2018  PT End of Session - 05/03/18 0935    Visit Number  16    Number of Visits  24    Date for PT Re-Evaluation  05/24/18    Authorization Type  [employer] - PT only    PT Start Time  0935    PT Stop Time  1016    PT Time Calculation (min)  41 min    Activity Tolerance  Patient tolerated treatment well    Behavior During Therapy  [facility] for tasks assessed/performed       Past Medical History:  Diagnosis Date  . GERD (gastroesophageal reflux disease)   . Hearing impaired   . Kidney stone     Past Surgical History:  Procedure Laterality Date  . ABDOMINAL HYSTERECTOMY      There were no vitals filed for this visit.  Subjective Assessment - 05/03/18 0939    Subjective  Pt reports she has been keeping up with her exercises while she had the flu. Also hass been practicing going up/down stairs w/o the brace or crutches.    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    Patient Stated Goals  "to get off the crutches and walk"    Currently in Pain?  No/denies         [facility] PT Assessment - 05/03/18 0935      Assessment   Medical Diagnosis  R patellar fracture    Referring Provider (PT)  Karlton Lemon, MD    Onset Date/Surgical Date  01/14/18    Next MD Visit  05/06/18                   [facility] Adult PT Treatment/Exercise - 05/03/18 0935      Ambulation/Gait   Stairs Assistance  5: Supervision    Stairs Assistance Details (indicate cue type and reason)  Progressed  ascent to reciprocal step pattern with increased effort noted when right leading. With attempt to lead with L foot on descent, pt turning sideways to rail.    Stair Management Technique  One rail Right;Step to pattern;Alternating pattern;Forwards    Number of Stairs  14   x 2   Height of Stairs  7      Exercises   Exercises  Knee/Hip      Knee/Hip Exercises: Aerobic   Recumbent Bike  L2 x 7 min (pillow behind back)      Knee/Hip Exercises: Machines for Strengthening   Cybex Knee Extension  B LE 10# x10    Cybex Knee Flexion  B LE 15# x10, B con/R ecc 10# x10      Knee/Hip Exercises: Standing   Forward Step Up  Right;15 reps;Step Height: 6";Hand Hold: 1    Forward Step Up Limitations  + blue TB TKE    Step Down  Right;15 reps;Step Height: 4";Hand Hold: 1    Step Down Limitations  lateral & forward eccentric lowering with light heel touch, cues for level pelvis    SLS with Vectors  R SLS on blue foam  oval with 3 way toe tap to cones x10    Other Standing Knee Exercises  R SLS RDL reach to mat table x10               PT Short Term Goals - 03/29/18 1501      PT SHORT TERM GOAL #1   Title  Independent with initial HEP    Status  Achieved      PT SHORT TERM GOAL #2   Title  Patient will ambuate WBAT on R in hinged brace with single axillary crutch or LRAD     Status  Achieved      PT SHORT TERM GOAL #3   Title  R knee AROM >/= 5-90 degrees    Status  Achieved        PT Long Term Goals - 04/12/18 1521      PT LONG TERM GOAL #1   Title  Independent with ongoing/advanced HEP    Status  On-going    Target Date  05/24/18      PT LONG TERM GOAL #2   Title  R knee AROM >/= 0-130 dg to allow for normal gait and stair negotiation    Status  Partially Met    Target Date  05/24/18      PT LONG TERM GOAL #3   Title  R hip and knee strength >/= 4+/5 for improved stability    Status  Partially Met    Target Date  05/24/18      PT LONG TERM GOAL #4   Title  Patient  will ambuate with normal gait pattern w/o AD    Status  On-going    Target Date  05/24/18      PT LONG TERM GOAL #5   Title  Patient will report ability to complete ADLs and light household chores w/o restriction due to R knee pain, LOM or weakness    Status  On-going    Target Date  05/24/18            Plan - 05/03/18 0941    Clinical Impression Statement  Joan Horn reporting she was able to keep up with her HEP while sick with the flu and has continued to wean from the knee brace and crutches, presenting to PT w/o either today. She reports improving confidence on the stairs w/o brace of crutches but still prefers step-to pattern with L LE leading on ascent - able to complete step-to ascent with R leading and reciprocal ascent but with slight vaulting/hopping on L with weight shift to R and only able to lead with L LE on descent if standing sideways facing rail. Exercises focusing on continued quad and HS strengthening with eccentric emphasis - no pain but pt fatiguing quickly.    Rehab Potential  Good    Clinical Impairments Affecting Rehab Potential  B sensorineural hearing loss - cochlear implant & pt reads lips    PT Treatment/Interventions  Patient/family education;Therapeutic exercise;Therapeutic activities;Functional mobility training;Gait training;Stair training;DME Instruction;Neuromuscular re-education;Balance training;Manual techniques;Passive range of motion;Taping;Dry needling;Cryotherapy;Vasopneumatic Device;Moist Heat;Electrical Stimulation;Iontophoresis 23m/ml Dexamethasone;ADLs/Self Care Home Management    PT Next Visit Plan  Gently progress R knee ROM; Progressive R hip and knee strengthening as tolerated; Gait training continuing to wean AD and brace as indicated; Manual therapy for ROM and muscle tension/TPs as indicated with taping as continued benefit noted; Modalties PRN for pain/edema    Consulted and Agree with Plan of Care  Patient  Patient will benefit from  skilled therapeutic intervention in order to improve the following deficits and impairments:  Pain, Decreased range of motion, Impaired flexibility, Increased muscle spasms, Decreased strength, Difficulty walking, Abnormal gait, Decreased activity tolerance, Decreased balance, Decreased endurance, Postural dysfunction, Improper body mechanics  Visit Diagnosis: Acute pain of right knee  Stiffness of right knee, not elsewhere classified  Muscle weakness (generalized)  Other abnormalities of gait and mobility  Difficulty in walking, not elsewhere classified     Problem List Patient Active Problem List   Diagnosis Date Noted  . Sensorineural hearing loss, bilateral 06/28/2014  . Osteoporosis 02/21/2014  . Nephrolithiasis 11/02/2013  . Hyperlipidemia 03/07/2013  . Calculus of ureter 02/14/2013  . Essential hypertension 02/01/2013  . Gastro-esophageal reflux disease without esophagitis 08/01/2012  . Irritable bowel syndrome without diarrhea 08/01/2012  . Migraine, unspecified, not intractable, without status migrainosus 12/31/2011    Percival Spanish, PT, MPT 05/03/2018, 3:15 PM  [facility] [address]  [place], [place], [address] Phone: [phone]   Fax:  [phone]  Name: Joan Horn MRN: [id number] Date of Birth: 04-29-1960

## 2018-05-06 ENCOUNTER — Ambulatory Visit: Admitting: Physical Therapy

## 2018-05-06 ENCOUNTER — Ambulatory Visit (INDEPENDENT_AMBULATORY_CARE_PROVIDER_SITE_OTHER): Admitting: Family Medicine

## 2018-05-06 ENCOUNTER — Other Ambulatory Visit: Payer: Self-pay

## 2018-05-06 ENCOUNTER — Encounter: Payer: Self-pay | Admitting: Family Medicine

## 2018-05-06 VITALS — BP 123/91 | HR 65 | Temp 97.7°F | Ht <= 58 in | Wt 108.0 lb

## 2018-05-06 DIAGNOSIS — S8991XD Unspecified injury of right lower leg, subsequent encounter: Secondary | ICD-10-CM | POA: Diagnosis not present

## 2018-05-06 NOTE — Patient Instructions (Signed)
Continue your home exercises daily. Tylenol if needed for pain. Let me know how you're doing in 6 weeks via phone or mychart. 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

## 2018-05-09 ENCOUNTER — Encounter: Payer: Self-pay | Admitting: Family Medicine

## 2018-05-09 NOTE — Progress Notes (Signed)
   CC: r knee patellar fracture  HPI: 02/01/18: R knee pain - report hx of fracture on 11/29, comes in today wearing brace. She was walking on a curb on black Friday and tripped striking directly on the R knee cap. She was in [place] and went to the ED - XR with patelllar fracture. She was placed in a knee immobilizer and given norco. She has been doing ok with pain, but more pain with when she accidentally bears weight (has crutches) or with sleep. No hx of trauma or knee surgery in the past.  Pain level 5/10 currently, sharp anterior knee.  No skin changes, numbness.  04/01/18: Patient reports she feels better - has good days and bad days. She's using 2 crutches still when out, 1 crutch at home. Doing physical therapy and home exercises - does home exercises 3 times a day. Sunday had a lot of pain anterior knee, sharp. Has been icing as well - had to take a pain pill on Sunday. Swelling at times. No new injuries.  3/20: Patient reports she's doing well. Not taking any medication for this. Pain up to 2/10 at worst. Driving herself now, walking without brace. Stairs still a problem, has to take one at a time. No skin changes.  SH, FH, Meds, Medical history, Allergies reviewed.  Objective: BP (!) 123/91   Pulse 65   Temp 97.7 F (36.5 C) (Oral)   Ht 4\' 10"  (1.473 m)   Wt 108 lb (49 kg)   BMI 22.57 kg/m   Gen: NAD, comfortable in exam room  Right knee: No gross deformity, ecchymoses, swelling. Mild TTP anterior patella.  No joint line, other tenderness currently. FROM. Negative ant/post drawers. Negative valgus/varus testing. Negative lachmans. Negative mcmurrays, apleys, patellar apprehension. NV intact distally.   Assessment/Plan: 1. Right patellar fracture - healed based on radiographs, ultrasound.  Has done extensive physical therapy, home exercises.  Slow progress to date but improving.  Tylenol if needed.  Continue home exercises daily.  Let us know how she's  doing in 6 weeks.  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

## 2018-05-10 ENCOUNTER — Ambulatory Visit: Admitting: Physical Therapy

## 2018-05-13 ENCOUNTER — Other Ambulatory Visit: Payer: Self-pay

## 2018-05-13 ENCOUNTER — Encounter: Payer: Self-pay | Admitting: Physical Therapy

## 2018-05-13 ENCOUNTER — Encounter: Admitting: Physical Therapy

## 2018-05-13 ENCOUNTER — Ambulatory Visit: Admitting: Physical Therapy

## 2018-05-13 DIAGNOSIS — M25561 Pain in right knee: Secondary | ICD-10-CM

## 2018-05-13 DIAGNOSIS — M6281 Muscle weakness (generalized): Secondary | ICD-10-CM

## 2018-05-13 DIAGNOSIS — R2689 Other abnormalities of gait and mobility: Secondary | ICD-10-CM

## 2018-05-13 DIAGNOSIS — R262 Difficulty in walking, not elsewhere classified: Secondary | ICD-10-CM

## 2018-05-13 DIAGNOSIS — M25661 Stiffness of right knee, not elsewhere classified: Secondary | ICD-10-CM

## 2018-05-13 NOTE — Therapy (Addendum)
[facility] [address]  [place], [place], [address] Phone: [phone]   Fax:  [phone]  Physical Therapy Treatment  Patient Details  Name: Joan Horn MRN: [id number] Date of Birth: [birth year] Referring Provider (PT): Karlton Lemon, MD   Encounter Date: 05/13/2018  PT End of Session - 05/13/18 0842    Visit Number  17    Number of Visits  24    Date for PT Re-Evaluation  05/24/18    Authorization Type  [employer] - PT only    PT Start Time  0842    PT Stop Time  0931    PT Time Calculation (min)  49 min    Activity Tolerance  Patient tolerated treatment well    Behavior During Therapy  [facility] for tasks assessed/performed       Past Medical History:  Diagnosis Date  . GERD (gastroesophageal reflux disease)   . Hearing impaired   . Kidney stone     Past Surgical History:  Procedure Laterality Date  . ABDOMINAL HYSTERECTOMY      There were no vitals filed for this visit.  Subjective Assessment - 05/13/18 0847    Subjective  Pt reports she has been contiuning to practice going up/down stairs and feels like she is improving with step-over-step ascent but still struggles with reciprocal descent.    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    Patient Stated Goals  "to get off the crutches and walk"    Currently in Pain?  No/denies                       [facility] Adult PT Treatment/Exercise - 05/13/18 0842      Ambulation/Gait   Stairs Assistance  5: Supervision    Stair Management Technique  Forwards;Alternating pattern;No rails;Step to pattern;One rail Right    Number of Stairs  14    Height of Stairs  7    Gait Comments  Pt able to ascend stairs reciprocally but with increased effort on R foot lead. Continues with  step-to descent with R foot lead (L eccentric lowering).      Exercises   Exercises  Knee/Hip      Knee/Hip Exercises: Aerobic   Recumbent Bike  L2 x 6 min (pillow behind back)      Knee/Hip Exercises: Machines for Strengthening   Cybex Knee Extension  B LE 10# x15, B con/R ecc 5# x15    Cybex Knee Flexion  B LE 20# x15, B con/R ecc 10# x15      Knee/Hip Exercises: Standing   Hip Flexion  Right;Left;15 reps;Knee straight;Stengthening    Hip Flexion Limitations  red TB at ankle + opp LE SLS on blue foam oval, 1 pole A    Hip ADduction  Right;Left;15 reps;Strengthening    Hip ADduction Limitations  red TB at ankle + opp LE SLS on blue foam oval, 1 pole A    Hip Abduction  Right;Left;15 reps;Knee straight;Stengthening    Abduction Limitations  red TB at ankle + opp LE SLS on blue foam oval, 1 pole A    Hip Extension  Right;Left;15 reps;Knee straight;Stengthening    Extension Limitations  red TB at ankle + opp LE SLS on blue foam oval, 1 pole A    Step  Down  Right;15 reps;Step Height: 6";Step Height: 4";Hand Hold: 1    Step Down Limitations  lateral (6") & forward (4") eccentric lowering with light heel touch, cues for level pelvis    Wall Squat  15 reps;3 seconds    Wall Squat Limitations  + hip adduction ball squeeze             PT Education - 05/13/18 0930    Education Details  HEP update - fwd & lat step-downs, wall squats    Person(s) Educated  Patient    Methods  Explanation;Demonstration;Handout    Comprehension  Verbalized understanding;Returned demonstration       PT Short Term Goals - 03/29/18 1501      PT SHORT TERM GOAL #1   Title  Independent with initial HEP    Status  Achieved      PT SHORT TERM GOAL #2   Title  Patient will ambuate WBAT on R in hinged brace with single axillary crutch or LRAD     Status  Achieved      PT SHORT TERM GOAL #3   Title  R knee AROM >/= 5-90 degrees    Status  Achieved        PT Long Term Goals - 04/12/18 1521       PT LONG TERM GOAL #1   Title  Independent with ongoing/advanced HEP    Status  On-going    Target Date  05/24/18      PT LONG TERM GOAL #2   Title  R knee AROM >/= 0-130 dg to allow for normal gait and stair negotiation    Status  Partially Met    Target Date  05/24/18      PT LONG TERM GOAL #3   Title  R hip and knee strength >/= 4+/5 for improved stability    Status  Partially Met    Target Date  05/24/18      PT LONG TERM GOAL #4   Title  Patient will ambuate with normal gait pattern w/o AD    Status  On-going    Target Date  05/24/18      PT LONG TERM GOAL #5   Title  Patient will report ability to complete ADLs and light household chores w/o restriction due to R knee pain, LOM or weakness    Status  On-going    Target Date  05/24/18            Plan - 05/13/18 0850    Clinical Impression Statement  Joan Horn reporting she has been working on the stairs at home and feels more comfortable with reciprocal ascent but still has to approach descent with step-to pattern with R leading/L eccentric lowering. Focused on eccentric quad strengthening with HEP updated to include step-downs and wall squat. Progressed standing 4 way SLR to SLS on blue foam oval to add proceptive and balance training to ongoing hip strengthening. Pt progressing well but will continue to benefit from further skilled PT to restore full quad and hip strength as well as improve balance to allow pt to return to work and normal daily activities.    Rehab Potential  Good    Clinical Impairments Affecting Rehab Potential  B sensorineural hearing loss - cochlear implant & pt reads lips    PT Treatment/Interventions  Patient/family education;Therapeutic exercise;Therapeutic activities;Functional mobility training;Gait training;Stair training;DME Instruction;Neuromuscular re-education;Balance training;Manual techniques;Passive range of motion;Taping;Dry needling;Cryotherapy;Vasopneumatic Device;Moist Heat;Electrical  Stimulation;Iontophoresis 43m/ml Dexamethasone;ADLs/Self Care Home Management  PT Next Visit Plan  Progressive R hip and knee strengthening with eccentric quad emphasis as tolerated; Gently progress R knee ROM; Gait & stair training to normalize step pattern; Manual therapy & modalties PRN for pain/edema    Consulted and Agree with Plan of Care  Patient       Patient will benefit from skilled therapeutic intervention in order to improve the following deficits and impairments:  Pain, Decreased range of motion, Impaired flexibility, Increased muscle spasms, Decreased strength, Difficulty walking, Abnormal gait, Decreased activity tolerance, Decreased balance, Decreased endurance, Postural dysfunction, Improper body mechanics  Visit Diagnosis: Acute pain of right knee  Stiffness of right knee, not elsewhere classified  Muscle weakness (generalized)  Other abnormalities of gait and mobility  Difficulty in walking, not elsewhere classified     Problem List Patient Active Problem List   Diagnosis Date Noted  . Sensorineural hearing loss, bilateral 06/28/2014  . Osteoporosis 02/21/2014  . Nephrolithiasis 11/02/2013  . Hyperlipidemia 03/07/2013  . Calculus of ureter 02/14/2013  . Essential hypertension 02/01/2013  . Gastro-esophageal reflux disease without esophagitis 08/01/2012  . Irritable bowel syndrome without diarrhea 08/01/2012  . Migraine, unspecified, not intractable, without status migrainosus 12/31/2011    Percival Spanish, PT, MPT 05/13/2018, 11:38 AM  [facility] [address]  [place], [place], [address] Phone: [phone]   Fax:  [phone]  Name: Joan Horn MRN: [id number] Date of Birth: 10/23/1960

## 2018-05-17 ENCOUNTER — Encounter: Admitting: Physical Therapy

## 2018-05-17 ENCOUNTER — Encounter: Payer: Self-pay | Admitting: Physical Therapy

## 2018-05-17 ENCOUNTER — Ambulatory Visit: Admitting: Physical Therapy

## 2018-05-17 ENCOUNTER — Other Ambulatory Visit: Payer: Self-pay

## 2018-05-17 DIAGNOSIS — R2689 Other abnormalities of gait and mobility: Secondary | ICD-10-CM

## 2018-05-17 DIAGNOSIS — M25661 Stiffness of right knee, not elsewhere classified: Secondary | ICD-10-CM

## 2018-05-17 DIAGNOSIS — M6281 Muscle weakness (generalized): Secondary | ICD-10-CM

## 2018-05-17 DIAGNOSIS — M25561 Pain in right knee: Secondary | ICD-10-CM

## 2018-05-17 DIAGNOSIS — R262 Difficulty in walking, not elsewhere classified: Secondary | ICD-10-CM

## 2018-05-17 NOTE — Therapy (Signed)
[facility] [address]  [place], [place], [address] Phone: [phone]   Fax:  [phone]  Physical Therapy Treatment / Recert  Patient Details  Name: Joan Horn MRN: [id number] Date of Birth: 1960/07/01 Referring Provider (PT): Karlton Lemon, MD  Progress Note  Reporting Period 03/29/2018 to 05/17/2018  See note below for Objective Data and Assessment of Progress/Goals.     Encounter Date: 05/17/2018  PT End of Session - 05/17/18 0838    Visit Number  18    Number of Visits  26    Date for PT Re-Evaluation  06/14/18    Authorization Type  [employer] - PT only    PT Start Time  0838    PT Stop Time  0926    PT Time Calculation (min)  48 min    Activity Tolerance  Patient tolerated treatment well    Behavior During Therapy  [facility] for tasks assessed/performed       Past Medical History:  Diagnosis Date  . GERD (gastroesophageal reflux disease)   . Hearing impaired   . Kidney stone     Past Surgical History:  Procedure Laterality Date  . ABDOMINAL HYSTERECTOMY      There were no vitals filed for this visit.  Subjective Assessment - 05/17/18 0840    Subjective  Pt doing well today - no concerns. Feels like she is walking more normally and stairs are getting easier, but still has trouble getting down to the floor and has not tried kneeling on R knee.    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    Patient Stated Goals  "to get off the crutches and walk"    Currently in Pain?  No/denies         [facility] PT Assessment - 05/17/18 6384      Assessment   Medical Diagnosis  R patellar fracture    Referring Provider (PT)  Karlton Lemon, MD    Onset Date/Surgical Date  01/14/18    Next MD Visit  pt to call/message MD at end of April to  determine need for further f/u      Observation/Other Assessments   Focus on Therapeutic Outcomes (FOTO)   Knee - 54% (46% limitation)      AROM   Right Knee Extension  0    Right Knee Flexion  134      Strength   Right Hip Flexion  4+/5    Right Hip Extension  4+/5    Right Hip External Rotation   4/5    Right Hip Internal Rotation  4+/5    Right Hip ABduction  5/5    Right Hip ADduction  4+/5    Left Hip Flexion  5/5    Left Hip Extension  4+/5    Left Hip External Rotation  4/5    Left Hip Internal Rotation  4+/5    Left Hip ABduction  5/5    Left Hip ADduction  5/5    Right Knee Flexion  4/5    Right Knee Extension  4-/5    Left Knee Flexion  5/5    Left Knee Extension  5/5                   [facility] Adult PT Treatment/Exercise - 05/17/18 5364  Exercises   Exercises  Knee/Hip      Knee/Hip Exercises: Aerobic   Recumbent Bike  L2 x 6 min (pillow behind back)      Knee/Hip Exercises: Standing   Other Standing Knee Exercises  R SLS eccentric sit down to chair + Airex pad with TRX support x 10 - pt reporting increased anterior increased knee pain, initially partially relieved with manual medial patellar glide by PT but only temporary relief therefore exercise discontinued    Other Standing Knee Exercises  B side stepping & fwd/back monster walk with red TB at ankles 2 x 20 ft      Knee/Hip Exercises: Supine   Bridges with Clamshell  Both;15 reps;Strengthening   alt hip ABD/ER with red TB   Other Supine Knee/Hip Exercises  Bridge + HS curl with heels on peanut ball x 15      Knee/Hip Exercises: Sidelying   Clams  R/L clam with red TB x 15             PT Education - 05/17/18 0925    Education Details  HEP update - clam, bridge clam, fwd/back/lateral monster walks    Person(s) Educated  Patient    Methods  Explanation;Demonstration;Handout    Comprehension  Verbalized understanding;Returned demonstration       PT Short Term Goals - 03/29/18  1501      PT SHORT TERM GOAL #1   Title  Independent with initial HEP    Status  Achieved      PT SHORT TERM GOAL #2   Title  Patient will ambuate WBAT on R in hinged brace with single axillary crutch or LRAD     Status  Achieved      PT SHORT TERM GOAL #3   Title  R knee AROM >/= 5-90 degrees    Status  Achieved        PT Long Term Goals - 05/17/18 0841      PT LONG TERM GOAL #1   Title  Independent with ongoing/advanced HEP    Status  Partially Met    Target Date  06/14/18      PT LONG TERM GOAL #2   Title  R knee AROM >/= 0-130 dg to allow for normal gait and stair negotiation    Status  Achieved      PT LONG TERM GOAL #3   Title  R hip and knee strength >/= 4+/5 for improved stability    Status  Partially Met    Target Date  06/14/18      PT LONG TERM GOAL #4   Title  Patient will ambuate with normal gait pattern w/o AD    Status  Achieved      PT LONG TERM GOAL #5   Title  Patient will report ability to complete ADLs and light household chores w/o restriction due to R knee pain, LOM or weakness    Status  Partially Met    Target Date  06/14/18      PT LONG TERM GOAL #6   Title  Patient will ascend/descend stairs with good reciprocal pattern w/o evidence of quad instability    Status  New    Target Date  06/14/18      PT LONG TERM GOAL #7   Title  Patient will be able to transfer to/from floor bearing weight on R knee 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 B LE strength improving with greatest weakness remaining proximally in hip rotators as well as R quads and hamstrings. Patient reporting good compliance with HEP and has been practicing  stair negotiation at home but still notes some difficulty with stair descent as well as getting down to lower heights or floor at home when attempting to clean house. Given ongoing deficits with good progress to date and definite potential for further improvement, will recommend extending POC for additional 4 weeks as patient has missed a few visits in current certification period due to illness and clinic restrictions from COVID-19.    Rehab Potential  Good    Clinical Impairments Affecting Rehab Potential  B sensorineural hearing loss - cochlear implant & pt reads lips    PT Frequency  2x / week    PT Duration  4 weeks    PT Treatment/Interventions  Patient/family education;Therapeutic exercise;Therapeutic activities;Functional mobility training;Gait training;Stair training;DME Instruction;Neuromuscular re-education;Balance training;Manual techniques;Passive range of motion;Taping;Dry needling;Cryotherapy;Vasopneumatic Device;Moist Heat;Electrical Stimulation;Iontophoresis 4m/ml Dexamethasone;ADLs/Self Care Home Management    PT Next Visit Plan  Progressive R hip and knee strengthening with hip rotator, hamstring and eccentric quad emphasis as tolerated; Stair training to normalize step pattern; Manual therapy & modalties PRN for pain/edema    Consulted and Agree with Plan of Care  Patient       Patient will benefit from skilled therapeutic intervention in order to improve the following deficits and impairments:  Pain, Decreased range of motion, Impaired flexibility, Increased muscle spasms, Decreased strength, Difficulty walking, Abnormal gait, Decreased activity tolerance, Decreased balance, Decreased endurance, Postural dysfunction, Improper body mechanics  Visit Diagnosis: Acute pain of right knee  Stiffness of right knee, not elsewhere classified  Muscle weakness (generalized)  Other abnormalities of gait and mobility  Difficulty in walking, not elsewhere classified     Problem  List Patient Active Problem List   Diagnosis Date Noted  . Sensorineural hearing loss, bilateral 06/28/2014  . Osteoporosis 02/21/2014  . Nephrolithiasis 11/02/2013  . Hyperlipidemia 03/07/2013  . Calculus of ureter 02/14/2013  . Essential hypertension 02/01/2013  . Gastro-esophageal reflux disease without esophagitis 08/01/2012  . Irritable bowel syndrome without diarrhea 08/01/2012  . Migraine, unspecified, not intractable, without status migrainosus 12/31/2011    JPercival Spanish PT, MPT 05/17/2018, 10:12 AM  [facility] [place] [address]Phone: [phone]  Fax:  [phone] Name: Joan KarisMRN: [id number] of Birth: 411-12-1960

## 2018-05-20 ENCOUNTER — Ambulatory Visit: Attending: Family Medicine | Admitting: Physical Therapy

## 2018-05-20 ENCOUNTER — Encounter: Payer: Self-pay | Admitting: Physical Therapy

## 2018-05-20 ENCOUNTER — Other Ambulatory Visit: Payer: Self-pay

## 2018-05-20 DIAGNOSIS — M25561 Pain in right knee: Secondary | ICD-10-CM

## 2018-05-20 DIAGNOSIS — R262 Difficulty in walking, not elsewhere classified: Secondary | ICD-10-CM | POA: Diagnosis present

## 2018-05-20 DIAGNOSIS — R2689 Other abnormalities of gait and mobility: Secondary | ICD-10-CM | POA: Insufficient documentation

## 2018-05-20 DIAGNOSIS — M6281 Muscle weakness (generalized): Secondary | ICD-10-CM | POA: Diagnosis present

## 2018-05-20 DIAGNOSIS — M25661 Stiffness of right knee, not elsewhere classified: Secondary | ICD-10-CM | POA: Diagnosis present

## 2018-05-20 NOTE — Therapy (Signed)
[facility] [address]  [place]  [place], [place], [address] Phone: [phone]   Fax:  [phone]  Physical Therapy Treatment  Patient Details  Name: Joan Horn MRN: [id number] Date of Birth: 1960-04-11 Referring Provider (PT): Karlton Lemon, MD   Encounter Date: 05/20/2018  PT End of Session - 05/20/18 0838    Visit Number  19    Number of Visits  26    Date for PT Re-Evaluation  06/14/18    Authorization Type  [employer] - PT only    PT Start Time  0838    PT Stop Time  0927    PT Time Calculation (min)  49 min    Activity Tolerance  Patient tolerated treatment well    Behavior During Therapy  [facility] for tasks assessed/performed       Past Medical History:  Diagnosis Date  . GERD (gastroesophageal reflux disease)   . Hearing impaired   . Kidney stone     Past Surgical History:  Procedure Laterality Date  . ABDOMINAL HYSTERECTOMY      There were no vitals filed for this visit.  Subjective Assessment - 05/20/18 0840    Subjective  Pt denies pain today but notes increased muscle soreness from latest HEP update.    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    Patient Stated Goals  "to get off the crutches and walk"    Currently in Pain?  No/denies                       [facility] Adult PT Treatment/Exercise - 05/20/18 0838      Exercises   Exercises  Knee/Hip      Knee/Hip Exercises: Aerobic   Recumbent Bike  L3 x 5 min (pillow behind back)      Knee/Hip Exercises: Machines for Strengthening   Cybex Knee Extension  B LE 10# x15, B con/R ecc 5# x15    Cybex Knee Flexion  B LE 25# x10, B con/R ecc 15# x10      Knee/Hip Exercises: Standing   Forward Lunges  Right;Left;5 reps;3 seconds;2 sets    Forward Lunges Limitations  TRX     Side Lunges  Right;Left;15 reps;3 seconds    Side Lunges Limitations  TRX - cues for neutral foot alignment with hip and knee, avoiding hip ER and knees forward of toes    Step Down  Right;15 reps;Step Height: 6";Hand Hold: 1    Step Down Limitations  lateral & forward eccentric lowering with light heel touch, cues for level pelvis & avoiding knee fwd of toes    Other Standing Knee Exercises  R SLS RDL reach with 2# to lowest height BATCA seat x15    Other Standing Knee Exercises  B side stepping & fwd/back monster walk with red TB at ankles 2 x 20 ft      Knee/Hip Exercises: Sidelying   Other Sidelying Knee/Hip Exercises  R/L side plank & side plank + clam 10 x 5 sec each               PT Short Term Goals - 03/29/18 1501      PT SHORT TERM GOAL #1   Title  Independent with initial HEP    Status  Achieved  PT SHORT TERM GOAL #2   Title  Patient will ambuate WBAT on R in hinged brace with single axillary crutch or LRAD     Status  Achieved      PT SHORT TERM GOAL #3   Title  R knee AROM >/= 5-90 degrees    Status  Achieved        PT Long Term Goals - 05/17/18 0841      PT LONG TERM GOAL #1   Title  Independent with ongoing/advanced HEP    Status  Partially Met    Target Date  06/14/18      PT LONG TERM GOAL #2   Title  R knee AROM >/= 0-130 dg to allow for normal gait and stair negotiation    Status  Achieved      PT LONG TERM GOAL #3   Title  R hip and knee strength >/= 4+/5 for improved stability    Status  Partially Met    Target Date  06/14/18      PT LONG TERM GOAL #4   Title  Patient will ambuate with normal gait pattern w/o AD    Status  Achieved      PT LONG TERM GOAL #5   Title  Patient will report ability to complete ADLs and light household chores w/o restriction due to R knee pain, LOM or weakness    Status  Partially Met    Target Date  06/14/18      PT LONG TERM GOAL #6   Title  Patient will ascend/descend stairs with good reciprocal  pattern w/o evidence of quad instability    Status  New    Target Date  06/14/18      PT LONG TERM GOAL #7   Title  Patient will be able to transfer to/from floor bearing weight on 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 HEP update reviewed reinforcing proper positioning and LE alignment with pt able to perform good return demonstration. Continued focus on hip rotator strengthening as well as R quad and HS strengthening with increased emphasis on R unilateral activities - pt requiring close supervision and intermittent redirection to ensure proper alignment and technique during several exercises, but denied any pain/discomfort other than mild muscle soreness.    Rehab Potential  Good    Clinical Impairments Affecting Rehab Potential  B sensorineural hearing loss - cochlear implant & pt reads lips    PT Frequency  --    PT Duration  --    PT Treatment/Interventions  Patient/family education;Therapeutic exercise;Therapeutic activities;Functional mobility training;Gait training;Stair training;DME Instruction;Neuromuscular re-education;Balance training;Manual techniques;Passive range of motion;Taping;Dry needling;Cryotherapy;Vasopneumatic Device;Moist Heat;Electrical Stimulation;Iontophoresis 70m/ml Dexamethasone;ADLs/Self Care Home Management    PT Next Visit Plan  Progressive R hip and knee strengthening with hip rotator, hamstring and eccentric quad emphasis as tolerated; Stair training to normalize step pattern; Manual therapy & modalties PRN for pain/edema    Consulted and Agree with Plan of Care  Patient       Patient will benefit from skilled therapeutic intervention in order to improve the following deficits and impairments:  Pain, Decreased range of motion, Impaired  flexibility, Increased muscle spasms, Decreased  strength, Difficulty walking, Abnormal gait, Decreased activity tolerance, Decreased balance, Decreased endurance, Postural dysfunction, Improper body mechanics  Visit Diagnosis: Acute pain of right knee  Stiffness of right knee, not elsewhere classified  Muscle weakness (generalized)  Other abnormalities of gait and mobility  Difficulty in walking, not elsewhere classified     Problem List Patient Active Problem List   Diagnosis Date Noted  . Sensorineural hearing loss, bilateral 06/28/2014  . Osteoporosis 02/21/2014  . Nephrolithiasis 11/02/2013  . Hyperlipidemia 03/07/2013  . Calculus of ureter 02/14/2013  . Essential hypertension 02/01/2013  . Gastro-esophageal reflux disease without esophagitis 08/01/2012  . Irritable bowel syndrome without diarrhea 08/01/2012  . Migraine, unspecified, not intractable, without status migrainosus 12/31/2011    Percival Spanish, PT, MPT 05/20/2018, 1:23 PM  [facility] [address]  [place], [place], [address] Phone: [phone]   Fax:  [phone]  Name: Joan Horn MRN: [id number] Date of Birth: 12-17-1960

## 2018-05-23 ENCOUNTER — Ambulatory Visit: Admitting: Physical Therapy

## 2018-05-24 ENCOUNTER — Encounter: Payer: Self-pay | Admitting: Physical Therapy

## 2018-05-24 ENCOUNTER — Ambulatory Visit: Admitting: Physical Therapy

## 2018-05-24 ENCOUNTER — Other Ambulatory Visit: Payer: Self-pay

## 2018-05-24 DIAGNOSIS — R262 Difficulty in walking, not elsewhere classified: Secondary | ICD-10-CM

## 2018-05-24 DIAGNOSIS — M25561 Pain in right knee: Secondary | ICD-10-CM | POA: Diagnosis not present

## 2018-05-24 DIAGNOSIS — R2689 Other abnormalities of gait and mobility: Secondary | ICD-10-CM

## 2018-05-24 DIAGNOSIS — M6281 Muscle weakness (generalized): Secondary | ICD-10-CM

## 2018-05-24 DIAGNOSIS — M25661 Stiffness of right knee, not elsewhere classified: Secondary | ICD-10-CM

## 2018-05-24 NOTE — Therapy (Signed)
[facility] [address]  [place], [place], [address] Phone: [phone]   Fax:  [phone]  Physical Therapy Treatment  Patient Details  Name: Joan Horn MRN: [id number] Date of Birth: 05/21/1960 Referring Provider (PT): Karlton Lemon, MD   Encounter Date: 05/24/2018  PT End of Session - 05/24/18 1228    Visit Number  20    Number of Visits  26    Date for PT Re-Evaluation  06/14/18    Authorization Type  [employer] - PT only    PT Start Time  1228    PT Stop Time  1318    PT Time Calculation (min)  50 min    Activity Tolerance  Patient tolerated treatment well    Behavior During Therapy  [facility] for tasks assessed/performed       Past Medical History:  Diagnosis Date  . GERD (gastroesophageal reflux disease)   . Hearing impaired   . Kidney stone     Past Surgical History:  Procedure Laterality Date  . ABDOMINAL HYSTERECTOMY      There were no vitals filed for this visit.  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    Patient Stated Goals  "to get off the crutches and walk"    Currently in Pain?  Yes    Pain Score  4     Pain Location  Knee    Pain Orientation  Right    Pain Type  Acute pain                       [facility] Adult PT Treatment/Exercise - 05/24/18 1228      Exercises   Exercises  Knee/Hip      Knee/Hip Exercises: Aerobic   Recumbent Bike  L2 x 6 min (pillow behind back)      Knee/Hip Exercises: Standing   Terminal Knee Extension  --    Theraband Level (Terminal Knee Extension)  --    Terminal Knee Extension Limitations  --    Forward Step Up  Right;10 reps;3 sets;Step Height: 6";Hand Hold: 1     Forward Step Up Limitations  + blue TB TKE (1 set each - anterior, medial & lateral pull from blue TB - emphasis on VMO activation while focusing on maintaining control of neutral knee)    Functional Squat Limitations  attempted supported squat + hip abduction/ER isometric with red TB - bettter tolerated than wall squat, but still deferred d/t increased medial knee pain    Wall Squat Limitations  attempted wall squat + hip abduction/ER isometric with red TB - deferred d/t increased medial knee pain      Knee/Hip Exercises: Supine   Short Arc Quad Sets  Right;15 reps;Strengthening    Short Arc Quad Sets Limitations  2# at ankle + hip adduction ball squeeze    Bridges with [employer]  Both;5 reps;2 sets;Strengthening   + alt knee extension   Straight Leg Raise with External Rotation  Right;15 reps;Strengthening    Straight Leg Raise with External Rotation Limitations  2#      Manual Therapy   Manual Therapy  Soft tissue mobilization;Myofascial release;Taping  Manual therapy comments  supine    Soft tissue mobilization  STM to mid/distal R quads and ITB    Myofascial Release  manual TPR to R distal quads (VL/VI/RF) at patella attachment    Kinesiotex  Inhibit Muscle      Kinesiotix   Edema  R knee - 50% patellar tendon strip + "X" over tibial tuberosity with 30-50% tails extending to VMO and VL               PT Short Term Goals - 03/29/18 1501      PT SHORT TERM GOAL #1   Title  Independent with initial HEP    Status  Achieved      PT SHORT TERM GOAL #2   Title  Patient will ambuate WBAT on R in hinged brace with single axillary crutch or LRAD     Status  Achieved      PT SHORT TERM GOAL #3   Title  R knee AROM >/= 5-90 degrees    Status  Achieved        PT Long Term Goals - 05/17/18 0841      PT LONG TERM GOAL #1   Title  Independent with ongoing/advanced HEP    Status  Partially Met    Target Date  06/14/18      PT LONG TERM GOAL #2   Title  R knee AROM >/=  0-130 dg to allow for normal gait and stair negotiation    Status  Achieved      PT LONG TERM GOAL #3   Title  R hip and knee strength >/= 4+/5 for improved stability    Status  Partially Met    Target Date  06/14/18      PT LONG TERM GOAL #4   Title  Patient will ambuate with normal gait pattern w/o AD    Status  Achieved      PT LONG TERM GOAL #5   Title  Patient will report ability to complete ADLs and light household chores w/o restriction due to R knee pain, LOM or weakness    Status  Partially Met    Target Date  06/14/18      PT LONG TERM GOAL #6   Title  Patient will ascend/descend stairs with good reciprocal pattern w/o evidence of quad instability    Status  New    Target Date  06/14/18      PT LONG TERM GOAL #7   Title  Patient will be able to transfer to/from floor bearing weight on 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 Pt ttp over medial patella along with distal quads and quad tendon insertion on patella - addressed with STM/MFR and taping along with quad focused strengthening exercises with emphasis on VMO, but limited tolerance for some exercises due to medial knee pain.    Rehab Potential  Good    Clinical Impairments Affecting Rehab Potential  B sensorineural hearing loss - cochlear implant & pt reads lips    PT Treatment/Interventions  Patient/family education;Therapeutic exercise;Therapeutic activities;Functional mobility training;Gait training;Stair training;DME  Instruction;Neuromuscular re-education;Balance training;Manual techniques;Passive range of motion;Taping;Dry needling;Cryotherapy;Vasopneumatic Device;Moist Heat;Electrical Stimulation;Iontophoresis 71m/ml  Dexamethasone;ADLs/Self Care Home Management    PT Next Visit Plan  Progressive R hip and knee strengthening with hip rotator, hamstring and eccentric quad emphasis as tolerated; Stair training to normalize step pattern; Manual therapy & modalties PRN for pain/edema    Consulted and Agree with Plan of Care  Patient       Patient will benefit from skilled therapeutic intervention in order to improve the following deficits and impairments:  Pain, Decreased range of motion, Impaired flexibility, Increased muscle spasms, Decreased strength, Difficulty walking, Abnormal gait, Decreased activity tolerance, Decreased balance, Decreased endurance, Postural dysfunction, Improper body mechanics  Visit Diagnosis: Acute pain of right knee  Stiffness of right knee, not elsewhere classified  Muscle weakness (generalized)  Other abnormalities of gait and mobility  Difficulty in walking, not elsewhere classified     Problem List Patient Active Problem List   Diagnosis Date Noted  . Sensorineural hearing loss, bilateral 06/28/2014  . Osteoporosis 02/21/2014  . Nephrolithiasis 11/02/2013  . Hyperlipidemia 03/07/2013  . Calculus of ureter 02/14/2013  . Essential hypertension 02/01/2013  . Gastro-esophageal reflux disease without esophagitis 08/01/2012  . Irritable bowel syndrome without diarrhea 08/01/2012  . Migraine, unspecified, not intractable, without status migrainosus 12/31/2011    JPercival Spanish PT, MPT 05/24/2018, 1:42 PM  [facility] [place] [address]Phone: [phone]  Fax:  [phone] Name: KAliha DiedrichMRN: [id number] of Birth: 4Apr 28, 1962

## 2018-05-27 ENCOUNTER — Ambulatory Visit: Admitting: Physical Therapy

## 2018-05-27 ENCOUNTER — Other Ambulatory Visit: Payer: Self-pay

## 2018-05-27 DIAGNOSIS — M25561 Pain in right knee: Secondary | ICD-10-CM | POA: Diagnosis not present

## 2018-05-27 DIAGNOSIS — M25661 Stiffness of right knee, not elsewhere classified: Secondary | ICD-10-CM

## 2018-05-27 DIAGNOSIS — M6281 Muscle weakness (generalized): Secondary | ICD-10-CM

## 2018-05-27 DIAGNOSIS — R2689 Other abnormalities of gait and mobility: Secondary | ICD-10-CM

## 2018-05-27 DIAGNOSIS — R262 Difficulty in walking, not elsewhere classified: Secondary | ICD-10-CM

## 2018-05-27 NOTE — Therapy (Signed)
[facility] [address]  [place], [place], [address] Phone: [phone]   Fax:  [phone]  Physical Therapy Treatment  Patient Details  Name: Joan Horn MRN: [id number] Date of Birth: 02/16/1961 Referring Provider (PT): Karlton Lemon, MD   Encounter Date: 05/27/2018  PT End of Session - 05/27/18 5188    Visit Number  21    Number of Visits  26    Date for PT Re-Evaluation  06/14/18    Authorization Type  [employer] - PT only    PT Start Time  0922    PT Stop Time  1014    PT Time Calculation (min)  52 min    Activity Tolerance  Patient tolerated treatment well    Behavior During Therapy  [facility] for tasks assessed/performed       Past Medical History:  Diagnosis Date  . GERD (gastroesophageal reflux disease)   . Hearing impaired   . Kidney stone     Past Surgical History:  Procedure Laterality Date  . ABDOMINAL HYSTERECTOMY      There were no vitals filed for this visit.  Subjective Assessment - 05/27/18 0925    Subjective  Pt reporting her knee continued to be painful through yesterday but no pain, only stiff today.    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    Patient Stated Goals  "to get off the crutches and walk"    Currently in Pain?  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      Exercises   Exercises  Knee/Hip      Knee/Hip Exercises: Aerobic   Recumbent Bike  L3 x 6 min (pillow behind back)      Knee/Hip Exercises: Standing   Lunge Walking - Round Trips  10 ft x 4 along counter with single UE support; 20 ft x 2 unsupported    SLS  R SLS (intermittent L TTWB for balance) + B red TB pallof press x 15    SLS with Vectors  R SLS on blue foam oval with 3 way toe tap to cones x 15               PT Short Term Goals - 03/29/18 1501      PT SHORT TERM GOAL #1   Title  Independent with initial HEP    Status  Achieved      PT SHORT TERM GOAL #2   Title  Patient will ambuate WBAT on R in hinged brace with single axillary crutch or LRAD     Status  Achieved      PT SHORT TERM GOAL #3   Title  R knee AROM >/= 5-90 degrees    Status  Achieved        PT Long Term Goals - 05/27/18 1045      PT LONG TERM GOAL #1   Title  Independent with ongoing/advanced HEP    Status  Partially Met      PT LONG TERM GOAL #2   Title  R knee AROM >/= 0-130 dg to allow for normal gait and stair negotiation    Status  Achieved      PT LONG TERM GOAL #3   Title  R hip and knee strength >/= 4+/5 for improved stability    Status  Partially Met      PT LONG TERM GOAL #4   Title  Patient will ambuate with normal gait pattern w/o AD    Status  Achieved      PT LONG TERM GOAL #5   Title  Patient will report ability to complete ADLs and light household chores w/o restriction due to R knee pain, LOM or weakness    Status  Partially Met      PT LONG TERM GOAL #6   Title  Patient will ascend/descend stairs with good reciprocal pattern w/o evidence of quad  instability    Status  On-going      PT LONG TERM GOAL #7   Title  Patient will be able to transfer to/from floor bearing weight on R knee 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 Given recent flare-up, shifted focus from strengthening to more proprioceptive and balance training as well as simulation of mobilty necessary for return to work. Pt reporting increased confidence with floor transfer and gait on ueven surfaces following training during session.    Rehab Potential  Good    Clinical Impairments Affecting Rehab Potential  B sensorineural hearing loss - cochlear implant & pt reads lips    PT Treatment/Interventions  Patient/family education;Therapeutic exercise;Therapeutic activities;Functional mobility training;Gait training;Stair training;DME Instruction;Neuromuscular re-education;Balance training;Manual techniques;Passive range of motion;Taping;Dry needling;Cryotherapy;Vasopneumatic Device;Moist Heat;Electrical Stimulation;Iontophoresis 52m/ml Dexamethasone;ADLs/Self Care Home Management    PT Next Visit Plan  Progressive R hip and knee strengthening with hip rotator, hamstring and eccentric quad emphasis as tolerated; Stair training to normalize step pattern; Manual therapy & modalties PRN for pain/edema    Consulted and Agree with Plan of Care  Patient       Patient will benefit from skilled therapeutic intervention in order to improve the following deficits and impairments:  Pain, Decreased range of motion, Impaired flexibility, Increased muscle spasms, Decreased strength, Difficulty walking, Abnormal gait, Decreased activity tolerance, Decreased balance, Decreased endurance, Postural dysfunction, Improper body mechanics  Visit Diagnosis: Acute pain of  right knee  Stiffness of right knee, not elsewhere classified  Muscle weakness (generalized)  Other abnormalities of gait and mobility  Difficulty in walking, not elsewhere classified     Problem List Patient Active Problem List   Diagnosis Date Noted  . Sensorineural hearing loss, bilateral 06/28/2014  .  Osteoporosis 02/21/2014  . Nephrolithiasis 11/02/2013  . Hyperlipidemia 03/07/2013  . Calculus of ureter 02/14/2013  . Essential hypertension 02/01/2013  . Gastro-esophageal reflux disease without esophagitis 08/01/2012  . Irritable bowel syndrome without diarrhea 08/01/2012  . Migraine, unspecified, not intractable, without status migrainosus 12/31/2011    Percival Spanish, PT, MPT 05/27/2018, 10:57 AM  [facility] [address]  [place], [place], [address] Phone: [phone]   Fax:  [phone]  Name: Sumaiyah Markert MRN: [id number] Date of Birth: 1960/04/23

## 2018-05-31 ENCOUNTER — Ambulatory Visit: Admitting: Physical Therapy

## 2018-05-31 ENCOUNTER — Other Ambulatory Visit: Payer: Self-pay

## 2018-05-31 ENCOUNTER — Encounter: Payer: Self-pay | Admitting: Physical Therapy

## 2018-05-31 DIAGNOSIS — R2689 Other abnormalities of gait and mobility: Secondary | ICD-10-CM

## 2018-05-31 DIAGNOSIS — M25561 Pain in right knee: Secondary | ICD-10-CM

## 2018-05-31 DIAGNOSIS — M6281 Muscle weakness (generalized): Secondary | ICD-10-CM

## 2018-05-31 DIAGNOSIS — M25661 Stiffness of right knee, not elsewhere classified: Secondary | ICD-10-CM

## 2018-05-31 DIAGNOSIS — R262 Difficulty in walking, not elsewhere classified: Secondary | ICD-10-CM

## 2018-05-31 NOTE — Therapy (Signed)
[facility] [address]  [place], [place], [address] Phone: [phone]   Fax:  [phone]  Physical Therapy Treatment  Patient Details  Name: Joan Horn MRN: [id number] Date of Birth: 10/21/1960 Referring Provider (PT): Karlton Lemon, MD   Encounter Date: 05/31/2018  PT End of Session - 05/31/18 1311    Visit Number  22    Number of Visits  26    Date for PT Re-Evaluation  06/14/18    Authorization Type  [employer] - PT only    PT Start Time  1311    PT Stop Time  1358    PT Time Calculation (min)  47 min    Activity Tolerance  Patient tolerated treatment well    Behavior During Therapy  [facility] for tasks assessed/performed       Past Medical History:  Diagnosis Date  . GERD (gastroesophageal reflux disease)   . Hearing impaired   . Kidney stone     Past Surgical History:  Procedure Laterality Date  . ABDOMINAL HYSTERECTOMY      There were no vitals filed for this visit.  Subjective Assessment - 05/31/18 1313    Subjective  Pt reporting her knee was bothering over the weekend but better today.    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    Patient Stated Goals  "to get off the crutches and walk"    Currently in Pain?  No/denies                       [facility] Adult PT Treatment/Exercise - 05/31/18 1311      Exercises   Exercises  Knee/Hip      Knee/Hip Exercises: Aerobic   Recumbent Bike  L3 x 6 min (pillow behind back)      Knee/Hip Exercises: Machines for Strengthening   Cybex Knee Extension  B LE 15# x11, B con/R ecc 10# x10    Cybex Knee Flexion  B LE 25# x15, B con/R ecc 15# x15    Cybex Leg Press  B LE 20# x15; B con/R ecc 15 x 10      Knee/Hip Exercises: Standing   Hip Flexion  Right;Left;15 reps;Knee  straight;Stengthening    Hip Flexion Limitations  green TB at ankle + opp LE SLS on blue foam oval, intermittent 1 pole A    Hip ADduction  Right;Left;10 reps;Strengthening    Hip ADduction Limitations  green TB at ankle + opp LE SLS on blue foam oval, intermittent 1 pole A    Hip Abduction  Right;Left;10 reps;Knee straight;Stengthening    Abduction Limitations  green TB at ankle + opp LE SLS on blue foam oval, intermittent 1 pole A    Hip Extension  Right;Left;10 reps;Knee straight;Stengthening    Extension Limitations  green TB at ankle + opp LE SLS on blue foam oval, intermittent 1 pole A    Rocker Board  4 minutes    Rocker Board Limitations  Inverted BOSU - static balance, lateral weight shift, heel-toe weight shift & mini-squat x ~1 minute each - intermittent 1 pole A     SLS  R SLS on blue foam oval + ball toss x 25 (stationary & moving PT target)  PT Education - 05/31/18 1358    Education Details  HEP - progressed standing 4-way SLR to green TB    Person(s) Educated  Patient    Methods  Explanation;Demonstration    Comprehension  Verbalized understanding;Returned demonstration       PT Short Term Goals - 03/29/18 1501      PT SHORT TERM GOAL #1   Title  Independent with initial HEP    Status  Achieved      PT SHORT TERM GOAL #2   Title  Patient will ambuate WBAT on R in hinged brace with single axillary crutch or LRAD     Status  Achieved      PT SHORT TERM GOAL #3   Title  R knee AROM >/= 5-90 degrees    Status  Achieved        PT Long Term Goals - 05/27/18 1045      PT LONG TERM GOAL #1   Title  Independent with ongoing/advanced HEP    Status  Partially Met      PT LONG TERM GOAL #2   Title  R knee AROM >/= 0-130 dg to allow for normal gait and stair negotiation    Status  Achieved      PT LONG TERM GOAL #3   Title  R hip and knee strength >/= 4+/5 for improved stability    Status  Partially Met      PT LONG TERM GOAL #4   Title   Patient will ambuate with normal gait pattern w/o AD    Status  Achieved      PT LONG TERM GOAL #5   Title  Patient will report ability to complete ADLs and light household chores w/o restriction due to R knee pain, LOM or weakness    Status  Partially Met      PT LONG TERM GOAL #6   Title  Patient will ascend/descend stairs with good reciprocal pattern w/o evidence of quad instability    Status  On-going      PT LONG TERM GOAL #7   Title  Patient will be able to transfer to/from floor bearing weight on 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 Able to tolerate kneeling briefly on her knees yesterday w/o pain. Resumed strengthening progression with good tolerance and no increased pain, only fatigue noted, with green TB provided for HEP. Also progressed balance and proprioceptive training with dynamic SLS stability activities on foam and weight shifting and mini-squats on inverted BOSU. Pt planning on phone f/u with MD on 06/06/18, therefore will reassess status and progress toward goals next visit.    Rehab Potential  Good    Clinical Impairments Affecting Rehab Potential  B sensorineural hearing loss - cochlear implant & pt reads lips    PT Treatment/Interventions  Patient/family education;Therapeutic exercise;Therapeutic activities;Functional mobility training;Gait training;Stair training;DME Instruction;Neuromuscular re-education;Balance training;Manual techniques;Passive range of motion;Taping;Dry needling;Cryotherapy;Vasopneumatic Device;Moist Heat;Electrical Stimulation;Iontophoresis 17m/ml Dexamethasone;ADLs/Self Care Home Management    PT Next Visit Plan  MD progress note for MD phone f/u on 06/06/18; Progressive R hip and knee  strengthening with hip rotator, hamstring and eccentric quad emphasis as  tolerated; Stair training to normalize step pattern; Manual therapy & modalties PRN for pain/edema    Consulted and Agree with Plan of Care  Patient       Patient will benefit from skilled therapeutic intervention in order to improve the following deficits and impairments:  Pain, Decreased range of motion, Impaired flexibility, Increased muscle spasms, Decreased strength, Difficulty walking, Abnormal gait, Decreased activity tolerance, Decreased balance, Decreased endurance, Postural dysfunction, Improper body mechanics  Visit Diagnosis: Acute pain of right knee  Stiffness of right knee, not elsewhere classified  Muscle weakness (generalized)  Other abnormalities of gait and mobility  Difficulty in walking, not elsewhere classified     Problem List Patient Active Problem List   Diagnosis Date Noted  . Sensorineural hearing loss, bilateral 06/28/2014  . Osteoporosis 02/21/2014  . Nephrolithiasis 11/02/2013  . Hyperlipidemia 03/07/2013  . Calculus of ureter 02/14/2013  . Essential hypertension 02/01/2013  . Gastro-esophageal reflux disease without esophagitis 08/01/2012  . Irritable bowel syndrome without diarrhea 08/01/2012  . Migraine, unspecified, not intractable, without status migrainosus 12/31/2011    Percival Spanish, PT, MPT 05/31/2018, 2:22 PM  [facility] [address]  [place], [place], [address] Phone: [phone]   Fax:  [phone]  Name: Joan Horn MRN: [id number] Date of Birth: 11/19/1960

## 2018-06-03 ENCOUNTER — Encounter: Payer: Self-pay | Admitting: Physical Therapy

## 2018-06-03 ENCOUNTER — Other Ambulatory Visit: Payer: Self-pay

## 2018-06-03 ENCOUNTER — Ambulatory Visit: Admitting: Physical Therapy

## 2018-06-03 DIAGNOSIS — M25561 Pain in right knee: Secondary | ICD-10-CM | POA: Diagnosis not present

## 2018-06-03 DIAGNOSIS — M25661 Stiffness of right knee, not elsewhere classified: Secondary | ICD-10-CM

## 2018-06-03 DIAGNOSIS — R262 Difficulty in walking, not elsewhere classified: Secondary | ICD-10-CM

## 2018-06-03 DIAGNOSIS — M6281 Muscle weakness (generalized): Secondary | ICD-10-CM

## 2018-06-03 DIAGNOSIS — R2689 Other abnormalities of gait and mobility: Secondary | ICD-10-CM

## 2018-06-03 NOTE — Therapy (Addendum)
[facility] [address]  [place], [place], [address] Phone: [phone]   Fax:  [phone]  Physical Therapy Treatment  Patient Details  Name: Joan Horn MRN: [id number] Date of Birth: 25-Jan-1961 Referring Provider (PT): Karlton Lemon, MD   Encounter Date: 06/03/2018  PT End of Session - 06/03/18 1012    Visit Number  23    Number of Visits  26    Date for PT Re-Evaluation  06/14/18    Authorization Type  [employer] - PT only    PT Start Time  1012    PT Stop Time  1104    PT Time Calculation (min)  52 min    Activity Tolerance  Patient tolerated treatment well    Behavior During Therapy  [facility] for tasks assessed/performed       Past Medical History:  Diagnosis Date  . GERD (gastroesophageal reflux disease)   . Hearing impaired   . Kidney stone     Past Surgical History:  Procedure Laterality Date  . ABDOMINAL HYSTERECTOMY      There were no vitals filed for this visit.      [facility] PT Assessment - 06/03/18 1012      Assessment   Medical Diagnosis  R patellar fracture    Referring Provider (PT)  Karlton Lemon, MD    Onset Date/Surgical Date  01/14/18    Next MD Visit  06/06/18 - phone f/u      Strength   Right Hip Flexion  5/5    Right Hip Extension  4+/5    Right Hip External Rotation   4/5    Right Hip Internal Rotation  4+/5    Right Hip ABduction  4+/5    Right Hip ADduction  4/5    Left Hip Flexion  5/5    Left Hip Extension  4+/5    Left Hip External Rotation  4+/5    Left Hip Internal Rotation  4+/5    Left Hip ABduction  5/5    Left Hip ADduction  5/5    Right Knee Flexion  4+/5    Right Knee Extension  4/5    Left Knee Flexion  5/5    Left Knee Extension  5/5      Ambulation/Gait   Ambulation/Gait Assistance  7: Independent    Gait Pattern  Within Functional Limits;Decreased weight shift to right    Stairs Assistance  5: Supervision    Stair Management Technique  No  rails;Alternating pattern;Forwards    Number of Stairs  14    Height of Stairs  7                   [facility] Adult PT Treatment/Exercise - 06/03/18 1012      Transfers   Transfers  Floor to Transfer    Floor to Transfer  5: Supervision;With upper extremity assist;Multiple attempts      Therapeutic Activites    Therapeutic Activities  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 with pt holding 10# cuff weight to simulate baby weight      Exercises   Exercises  Knee/Hip      Knee/Hip Exercises: Aerobic   Recumbent Bike  L3 x 6 min (pillow behind back)      Knee/Hip Exercises: Standing   Side Lunges  Right;Left;15 reps;3 seconds    Side Lunges Limitations  TRX - repeated cues  for neutral foot alignment with hip and knee, avoiding hip ER and knees forward of toes    SLS  R single leg squat with L foot supported on mat table 2 x 10; UE support on back of chair - cues to avoid R knee forward fo toes      Knee/Hip Exercises: Sidelying   Clams  R/L clam with green TB x 15    Other Sidelying Knee/Hip Exercises  R/L side plank 10 x 5 sec each               PT Short Term Goals - 03/29/18 1501      PT SHORT TERM GOAL #1   Title  Independent with initial HEP    Status  Achieved      PT SHORT TERM GOAL #2   Title  Patient will ambuate WBAT on R in hinged brace with single axillary crutch or LRAD     Status  Achieved      PT SHORT TERM GOAL #3   Title  R knee AROM >/= 5-90 degrees    Status  Achieved        PT Long Term Goals - 06/03/18 1021      PT LONG TERM GOAL #1   Title  Independent with ongoing/advanced HEP    Status  Partially Met      PT LONG TERM GOAL #2   Title  R knee AROM >/= 0-130 dg to allow for normal gait and stair negotiation    Status  Achieved      PT LONG TERM GOAL #3   Title  R hip and knee strength >/= 4+/5 for improved stability    Status  Partially Met      PT LONG TERM GOAL  #4   Title  Patient will ambuate with normal gait pattern w/o AD    Status  Achieved      PT LONG TERM GOAL #5   Title  Patient will report ability to complete ADLs and light household chores w/o restriction due to R knee pain, LOM or weakness    Status  Achieved      PT LONG TERM GOAL #6   Title  Patient will ascend/descend stairs with good reciprocal pattern w/o evidence of quad instability    Status  Partially Met      PT LONG TERM GOAL #7   Title  Patient will be able to transfer to/from floor bearing weight on R knee 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 R hip and knee strength improving, with greatest weakness still evident in R quads (especially with eccentric control) as well as hip adduction and ER. Stair negotiation improving with good reciprocal ascent but still limited eccentric quad control noted on R with descent, although no longer dependent on railing. 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 LTGs #2, 4 &  5 now met with remaining goals partially met. 3 visits (1.5 weeks) remaining in current POC and will assess need for recert at that time.    Rehab Potential  Good    Clinical Impairments Affecting Rehab Potential  B sensorineural hearing loss - cochlear implant & pt reads lips    PT Treatment/Interventions  Patient/family education;Therapeutic exercise;Therapeutic activities;Functional mobility training;Gait training;Stair training;DME  Instruction;Neuromuscular re-education;Balance training;Manual techniques;Passive range of motion;Taping;Dry needling;Cryotherapy;Vasopneumatic Device;Moist Heat;Electrical Stimulation;Iontophoresis 65m/ml Dexamethasone;ADLs/Self Care Home Management    PT Next Visit Plan  Progressive R hip and knee strengthening with hip rotator, hamstring and eccentric quad emphasis as tolerated; Stair training focusing on eccentric control to normalize step pattern; Work simulation - floor transfers; Manual therapy & modalties PRN for pain/edema    Consulted and Agree with Plan of Care  Patient       Patient will benefit from skilled therapeutic intervention in order to improve the following deficits and impairments:  Pain, Decreased range of motion, Impaired flexibility, Increased muscle spasms, Decreased strength, Difficulty walking, Abnormal gait, Decreased activity tolerance, Decreased balance, Decreased endurance, Postural dysfunction, Improper body mechanics  Visit Diagnosis: Acute pain of right knee  Stiffness of right knee, not elsewhere classified  Muscle weakness (generalized)  Other abnormalities of gait and mobility  Difficulty in walking, not elsewhere classified     Problem List Patient Active Problem List   Diagnosis Date Noted  . Sensorineural hearing loss, bilateral 06/28/2014  . Osteoporosis 02/21/2014  . Nephrolithiasis 11/02/2013  . Hyperlipidemia 03/07/2013  . Calculus of ureter 02/14/2013  . Essential hypertension 02/01/2013  . Gastro-esophageal reflux disease without esophagitis 08/01/2012  . Irritable bowel syndrome without diarrhea 08/01/2012  . Migraine, unspecified, not intractable, without status migrainosus 12/31/2011    JPercival Spanish PT, MPT 06/03/2018, 12:14 PM  [facility] [place] [address]Phone: [phone]  Fax:  [phone] Name: KEmaree ChiuMRN:  [id number] of Birth: 4Feb 03, 1962

## 2018-06-06 ENCOUNTER — Telehealth: Payer: Self-pay | Admitting: Family Medicine

## 2018-06-07 ENCOUNTER — Other Ambulatory Visit: Payer: Self-pay

## 2018-06-07 ENCOUNTER — Encounter: Payer: Self-pay | Admitting: Physical Therapy

## 2018-06-07 ENCOUNTER — Ambulatory Visit: Admitting: Physical Therapy

## 2018-06-07 DIAGNOSIS — M25561 Pain in right knee: Secondary | ICD-10-CM | POA: Diagnosis not present

## 2018-06-07 DIAGNOSIS — R262 Difficulty in walking, not elsewhere classified: Secondary | ICD-10-CM

## 2018-06-07 DIAGNOSIS — M25661 Stiffness of right knee, not elsewhere classified: Secondary | ICD-10-CM

## 2018-06-07 DIAGNOSIS — R2689 Other abnormalities of gait and mobility: Secondary | ICD-10-CM

## 2018-06-07 DIAGNOSIS — M6281 Muscle weakness (generalized): Secondary | ICD-10-CM

## 2018-06-07 NOTE — Therapy (Signed)
[facility] [address]  [place], [place], [address] Phone: [phone]   Fax:  [phone]  Physical Therapy Treatment  Patient Details  Name: Joan Horn MRN: [id number] Date of Birth: 14-Mar-1960 Referring Provider (PT): Karlton Lemon, MD   Encounter Date: 06/07/2018  PT End of Session - 06/07/18 1311    Visit Number  24    Number of Visits  26    Date for PT Re-Evaluation  06/14/18    Authorization Type  [employer] - PT only    PT Start Time  1311    PT Stop Time  1409    PT Time Calculation (min)  58 min    Activity Tolerance  Patient tolerated treatment well;Patient limited by pain    Behavior During Therapy  [facility] for tasks assessed/performed       Past Medical History:  Diagnosis Date  . GERD (gastroesophageal reflux disease)   . Hearing impaired   . Kidney stone     Past Surgical History:  Procedure Laterality Date  . ABDOMINAL HYSTERECTOMY      There were no vitals filed for this visit.  Subjective Assessment - 06/07/18 1311    Subjective  Pt reporting she is having a better week pain-wise this week.    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    Patient Stated Goals  "to get off the crutches and walk"    Currently in Pain?  No/denies                       [facility] Adult PT Treatment/Exercise - 06/07/18 1311      Knee/Hip Exercises: Stretches   ITB Stretch  Right;30 seconds;2 reps   each   ITB Stretch Limitations  lateral lean over back of chair & standing at wall      Knee/Hip Exercises: Aerobic   Recumbent Bike  L3 x 7 min (pillow behind back)      Knee/Hip Exercises: Standing   Step Down  Right;10 reps;Step Height: 6";Hand Hold: 2    Step Down Limitations  lateral eccentric lowering with toe touch & UE  support on TM rail - discontinued d/t increased anterior knee pain    Functional Squat  10 reps;3 seconds    Functional Squat Limitations  eccentric sit-downs to box + Airex pad - pt c/o increased anterolateral R knee pain, therefore attempted increasing heightto mat table & adding isometric hip abduction with green TB at knee but pain persitsed, therefore deferred further reps    Wall Squat  15 reps;3 seconds    Wall Squat Limitations  + hip adduction ball squeeze    Other Standing Knee Exercises  B side stepping & fwd/back monster walk with red TB at ankles 2 x 20 ft; B side stepping with looped red TB at midfoot 2 x 20 ft      Modalities   Modalities  Vasopneumatic      Vasopneumatic   Number Minutes Vasopneumatic   10 minutes    Vasopnuematic Location   Knee   R   Vasopneumatic Pressure  High    Vasopneumatic Temperature   coldest      Manual Therapy   Manual Therapy  Soft tissue mobilization;Myofascial release;Taping    Manual therapy comments  seated  Soft tissue mobilization  STM to distal R ITB    Myofascial Release  pin & stretch to R distal ITB    Kinesiotex  Inhibit Muscle      Kinesiotix   Inhibit Muscle   R ITB 30-50% from insertion to mid lateral thigh + 50% perpendicular strip at area of greatest tenderness               PT Short Term Goals - 03/29/18 1501      PT SHORT TERM GOAL #1   Title  Independent with initial HEP    Status  Achieved      PT SHORT TERM GOAL #2   Title  Patient will ambuate WBAT on R in hinged brace with single axillary crutch or LRAD     Status  Achieved      PT SHORT TERM GOAL #3   Title  R knee AROM >/= 5-90 degrees    Status  Achieved        PT Long Term Goals - 06/03/18 1021      PT LONG TERM GOAL #1   Title  Independent with ongoing/advanced HEP    Status  Partially Met      PT LONG TERM GOAL #2   Title  R knee AROM >/= 0-130 dg to allow for normal gait and stair negotiation    Status  Achieved      PT LONG  TERM GOAL #3   Title  R hip and knee strength >/= 4+/5 for improved stability    Status  Partially Met      PT LONG TERM GOAL #4   Title  Patient will ambuate with normal gait pattern w/o AD    Status  Achieved      PT LONG TERM GOAL #5   Title  Patient will report ability to complete ADLs and light household chores w/o restriction due to R knee pain, LOM or weakness    Status  Achieved      PT LONG TERM GOAL #6   Title  Patient will ascend/descend stairs with good reciprocal pattern w/o evidence of quad instability    Status  Partially Met      PT LONG TERM GOAL #7   Title  Patient will be able to transfer to/from floor bearing weight on R knee 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 Pain persisting with most weight bearing knee flexion/quad strengthening exercises depsite modification of alignment and cues for valgus/varus control. Patient very ttp over distal ITB with limited tolerance for STM/MFR, therefore applied rock tape to decrease tension/irritation along ITB. Treatment concluded with vasopneumatic compression to furter reduce irritation/inflammation. Patient instructed to rest for remainder of day and resume HEP tomorrow only if pain subsided.    Rehab Potential  Good    Clinical Impairments Affecting Rehab Potential  B sensorineural hearing loss - cochlear implant & pt reads lips    PT Treatment/Interventions  Patient/family education;Therapeutic exercise;Therapeutic activities;Functional mobility training;Gait training;Stair training;DME Instruction;Neuromuscular re-education;Balance training;Manual techniques;Passive range of motion;Taping;Dry needling;Cryotherapy;Vasopneumatic Device;Moist Heat;Electrical  Stimulation;Iontophoresis 33m/ml Dexamethasone;ADLs/Self Care Home Management    PT Next Visit Plan  Progressive R hip and knee strengthening  with hip rotator, hamstring and eccentric quad emphasis as tolerated; Stair training focusing on eccentric control to normalize step pattern; Work simulation - floor transfers; Manual therapy & modalties PRN for pain/edema    Consulted and Agree with Plan of Care  Patient       Patient will benefit from skilled therapeutic intervention in order to improve the following deficits and impairments:  Pain, Decreased range of motion, Impaired flexibility, Increased muscle spasms, Decreased strength, Difficulty walking, Abnormal gait, Decreased activity tolerance, Decreased balance, Decreased endurance, Postural dysfunction, Improper body mechanics  Visit Diagnosis: Acute pain of right knee  Stiffness of right knee, not elsewhere classified  Muscle weakness (generalized)  Other abnormalities of gait and mobility  Difficulty in walking, not elsewhere classified     Problem List Patient Active Problem List   Diagnosis Date Noted  . Sensorineural hearing loss, bilateral 06/28/2014  . Osteoporosis 02/21/2014  . Nephrolithiasis 11/02/2013  . Hyperlipidemia 03/07/2013  . Calculus of ureter 02/14/2013  . Essential hypertension 02/01/2013  . Gastro-esophageal reflux disease without esophagitis 08/01/2012  . Irritable bowel syndrome without diarrhea 08/01/2012  . Migraine, unspecified, not intractable, without status migrainosus 12/31/2011    Percival Spanish, PT, MPT 06/07/2018, 2:42 PM  [facility] [address]  [place], [place], [address] Phone: [phone]   Fax:  [phone]  Name: Joan Horn MRN: [id number] Date of Birth: [birth year]

## 2018-06-10 ENCOUNTER — Ambulatory Visit: Admitting: Physical Therapy

## 2018-06-10 ENCOUNTER — Other Ambulatory Visit: Payer: Self-pay

## 2018-06-10 ENCOUNTER — Encounter: Payer: Self-pay | Admitting: Physical Therapy

## 2018-06-10 DIAGNOSIS — M25661 Stiffness of right knee, not elsewhere classified: Secondary | ICD-10-CM

## 2018-06-10 DIAGNOSIS — M25561 Pain in right knee: Secondary | ICD-10-CM | POA: Diagnosis not present

## 2018-06-10 DIAGNOSIS — M6281 Muscle weakness (generalized): Secondary | ICD-10-CM

## 2018-06-10 DIAGNOSIS — R2689 Other abnormalities of gait and mobility: Secondary | ICD-10-CM

## 2018-06-10 DIAGNOSIS — R262 Difficulty in walking, not elsewhere classified: Secondary | ICD-10-CM

## 2018-06-10 NOTE — Therapy (Signed)
[facility] [address]  [place], [place], [address] Phone: [phone]   Fax:  [phone]  Physical Therapy Treatment  Patient Details  Name: Joan Horn MRN: [id number] Date of Birth: 04-Jan-1961 Referring Provider (PT): Karlton Lemon, MD   Encounter Date: 06/10/2018  PT End of Session - 06/10/18 1020    Visit Number  25    Number of Visits  26    Date for PT Re-Evaluation  06/14/18    Authorization Type  [employer] - PT only    PT Start Time  1020    PT Stop Time  1104    PT Time Calculation (min)  44 min    Activity Tolerance  Patient tolerated treatment well;No increased pain    Behavior During Therapy  WFL for tasks assessed/performed       Past Medical History:  Diagnosis Date  . GERD (gastroesophageal reflux disease)   . Hearing impaired   . Kidney stone     Past Surgical History:  Procedure Laterality Date  . ABDOMINAL HYSTERECTOMY      There were no vitals filed for this visit.  Subjective Assessment - 06/10/18 1023    Subjective  Pt reporting she is still having some of the "pressure like" soreness from last visit but it has not stopped her from doing her exercises.    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    Patient Stated Goals  "to get off the crutches and walk"    Currently in Pain?  Yes    Pain Score  4     Pain Location  Knee    Pain Orientation  Right;Lateral    Pain Descriptors / Indicators  Pressure;Sore    Pain Type  Acute pain    Pain Frequency  Intermittent                       [facility] Adult PT Treatment/Exercise - 06/10/18 1020      Exercises   Exercises  Knee/Hip      Knee/Hip Exercises: Stretches   Passive Hamstring Stretch  Right;30 seconds;3 reps   2 sets   Passive Hamstring  Stretch Limitations  supine manual 3-way HS stretch by PT isolating medial & lateral HS; 3--way HS stretch repeated by pt with strap    ITB Stretch  Right;30 seconds;2 reps    ITB Stretch Limitations  supine with strap      Knee/Hip Exercises: Aerobic   Recumbent Bike  L3 x 7 min (pillow behind back)      Knee/Hip Exercises: Standing   Functional Squat  15 reps;3 seconds    Functional Squat Limitations  TRX - working on increased depth w/in pain-free range    Lunge Walking - Round Trips  10 ft x 2 along counter with single UE support; 10 ft x 2 unsupported; 10 ft x 4 with trunk rotation holding yellow med ball to side of fwd leg    Other Standing Knee Exercises  R SLS squat to box + 2 Airex pads x 10 with TRX support      Manual Therapy   Manual Therapy  Soft tissue mobilization;Other (comment)    Soft tissue mobilization  STM to distal R HS    Other Manual Therapy  Reviewed how to perform  self-STM to HS, ITB & hip adductors using rolling pin as pt reporting only using rolling pin for quads.               PT Short Term Goals - 03/29/18 1501      PT SHORT TERM GOAL #1   Title  Independent with initial HEP    Status  Achieved      PT SHORT TERM GOAL #2   Title  Patient will ambuate WBAT on R in hinged brace with single axillary crutch or LRAD     Status  Achieved      PT SHORT TERM GOAL #3   Title  R knee AROM >/= 5-90 degrees    Status  Achieved        PT Long Term Goals - 06/03/18 1021      PT LONG TERM GOAL #1   Title  Independent with ongoing/advanced HEP    Status  Partially Met      PT LONG TERM GOAL #2   Title  R knee AROM >/= 0-130 dg to allow for normal gait and stair negotiation    Status  Achieved      PT LONG TERM GOAL #3   Title  R hip and knee strength >/= 4+/5 for improved stability    Status  Partially Met      PT LONG TERM GOAL #4   Title  Patient will ambuate with normal gait pattern w/o AD    Status  Achieved      PT LONG TERM GOAL #5    Title  Patient will report ability to complete ADLs and light household chores w/o restriction due to R knee pain, LOM or weakness    Status  Achieved      PT LONG TERM GOAL #6   Title  Patient will ascend/descend stairs with good reciprocal pattern w/o evidence of quad instability    Status  Partially Met      PT LONG TERM GOAL #7   Title  Patient will be able to transfer to/from floor bearing weight on R knee 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 Increased muscle tension in distal HS & ITB with ttp elicited over distal HS at tendinous junction, medial > lateral today - reviewed HS & ITB stretching wtih instructions on how to isolate medial and lateral HS with pt noting some relief with this. Also reviewed self-STM with rolling pin for HS, ITB & hip adductors as patient reports she has only been using this for quads. Better tolerance for exercises today with pt denying any increased pain, even with increased uniliateral strengthening exercises on R.    Rehab Potential  Good    Clinical Impairments Affecting Rehab Potential  B sensorineural hearing loss - cochlear implant & pt reads lips    PT Treatment/Interventions  Patient/family education;Therapeutic exercise;Therapeutic activities;Functional mobility training;Gait training;Stair training;DME Instruction;Neuromuscular re-education;Balance training;Manual techniques;Passive range of motion;Taping;Dry needling;Cryotherapy;Vasopneumatic Device;Moist Heat;Electrical Stimulation;Iontophoresis 8m/ml Dexamethasone;ADLs/Self Care Home Management    PT Next Visit Plan  Progressive R hip and knee strengthening with hip rotator, hamstring and eccentric quad emphasis as tolerated; Stair training focusing on  eccentric control to normalize step  pattern; Work simulation - floor transfers; Manual therapy & modalties PRN for pain/edema    Consulted and Agree with Plan of Care  Patient       Patient will benefit from skilled therapeutic intervention in order to improve the following deficits and impairments:  Pain, Decreased range of motion, Impaired flexibility, Increased muscle spasms, Decreased strength, Difficulty walking, Abnormal gait, Decreased activity tolerance, Decreased balance, Decreased endurance, Postural dysfunction, Improper body mechanics  Visit Diagnosis: Acute pain of right knee  Stiffness of right knee, not elsewhere classified  Muscle weakness (generalized)  Other abnormalities of gait and mobility  Difficulty in walking, not elsewhere classified     Problem List Patient Active Problem List   Diagnosis Date Noted  . Sensorineural hearing loss, bilateral 06/28/2014  . Osteoporosis 02/21/2014  . Nephrolithiasis 11/02/2013  . Hyperlipidemia 03/07/2013  . Calculus of ureter 02/14/2013  . Essential hypertension 02/01/2013  . Gastro-esophageal reflux disease without esophagitis 08/01/2012  . Irritable bowel syndrome without diarrhea 08/01/2012  . Migraine, unspecified, not intractable, without status migrainosus 12/31/2011    Percival Spanish, PT, MPT 06/10/2018, 1:25 PM  [facility] [address]  [place], [place], [address] Phone: [phone]   Fax:  [phone]  Name: Joan Horn MRN: [id number] Date of Birth: [birth year]

## 2018-06-14 ENCOUNTER — Encounter: Payer: Self-pay | Admitting: Physical Therapy

## 2018-06-14 ENCOUNTER — Ambulatory Visit: Admitting: Physical Therapy

## 2018-06-14 ENCOUNTER — Other Ambulatory Visit: Payer: Self-pay

## 2018-06-14 DIAGNOSIS — R262 Difficulty in walking, not elsewhere classified: Secondary | ICD-10-CM

## 2018-06-14 DIAGNOSIS — M6281 Muscle weakness (generalized): Secondary | ICD-10-CM

## 2018-06-14 DIAGNOSIS — R2689 Other abnormalities of gait and mobility: Secondary | ICD-10-CM

## 2018-06-14 DIAGNOSIS — M25561 Pain in right knee: Secondary | ICD-10-CM

## 2018-06-14 DIAGNOSIS — M25661 Stiffness of right knee, not elsewhere classified: Secondary | ICD-10-CM

## 2018-06-14 NOTE — Therapy (Signed)
[facility] [address]  [place], [place], [address] Phone: [phone]   Fax:  [phone]  Physical Therapy Treatment / Recert  Patient Details  Name: Joan Horn MRN: [id number] Date of Birth: [birth year] Referring Provider (PT): Karlton Lemon, MD  Progress Note  Reporting Period 05/17/18 to 06/14/18  See note below for Objective Data and Assessment of Progress/Goals.    Encounter Date: 06/14/2018  PT End of Session - 06/14/18 1321    Visit Number  26    Number of Visits  30    Date for PT Re-Evaluation  07/12/18    Authorization Type  [employer] - PT only    PT Start Time  5465    PT Stop Time  1403    PT Time Calculation (min)  42 min    Activity Tolerance  Patient tolerated treatment well;No increased pain    Behavior During Therapy  WFL for tasks assessed/performed       Past Medical History:  Diagnosis Date  . GERD (gastroesophageal reflux disease)   . Hearing impaired   . Kidney stone     Past Surgical History:  Procedure Laterality Date  . ABDOMINAL HYSTERECTOMY      There were no vitals filed for this visit.  Subjective Assessment - 06/14/18 1325    Subjective  Pt doing better today but still interested in continuing PT as she still has been fluctuating with good and bad days.    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    Patient Stated Goals  "to get off the crutches and walk"    Currently in Pain?  No/denies         [facility] PT Assessment - 06/14/18 1321      Assessment   Medical Diagnosis  R patellar fracture    Referring Provider (PT)  Karlton Lemon, MD    Onset Date/Surgical Date  01/14/18    Next MD Visit  PRN      Observation/Other Assessments   Focus on Therapeutic Outcomes (FOTO)   Knee - 45% (55%  limitation)      Strength   Right Hip Flexion  4+/5    Right Hip Extension  4+/5    Right Hip External Rotation   4/5    Right Hip Internal Rotation  4+/5    Right Hip ABduction  4+/5    Right Hip ADduction  4/5    Left Hip Flexion  4+/5    Left Hip Extension  4+/5    Left Hip External Rotation  4+/5    Left Hip Internal Rotation  4+/5    Left Hip ABduction  5/5    Left Hip ADduction  5/5    Right Knee Flexion  4/5    Right Knee Extension  4/5    Left Knee Flexion  4+/5    Left Knee Extension  5/5                   [facility] Adult PT Treatment/Exercise - 06/14/18 1321      Transfers   Transfers  Floor to Transfer    Floor to Transfer  5: Supervision;With upper extremity assist;Multiple attempts      Ambulation/Gait   Ambulation/Gait Assistance  7: Independent    Gait Pattern  Within Functional Limits  Stairs Assistance  6: Modified independent (Device/Increase time)    Stair Management Technique  No rails;Alternating pattern;Forwards    Number of Stairs  14    Height of Stairs  7    Gait Comments  Patient reporting increased comfort with reciprocal stair negotiation however Increased effort noted with R foot leading on ascent and still with limited eccentric quad control on R during descent.      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 Also simulated reaching over crib rail to place baby in crib using 10# reaching over TM rail as pt concerned about this having had to previously stand on tip-toes to reach crib mattress. Pt reporting no concerns with simulated crib, but still not totally comfortable with floor to stand while holding simulated baby.      Exercises   Exercises  Knee/Hip      Knee/Hip Exercises: Aerobic   Recumbent Bike  L3 x 7 min (pillow behind back)      Knee/Hip Exercises: Standing    Heel Raises  Both;10 reps;5 seconds      Knee/Hip Exercises: Seated   Sit to Sand  10 reps;without UE support               PT Short Term Goals - 03/29/18 1501      PT SHORT TERM GOAL #1   Title  Independent with initial HEP    Status  Achieved      PT SHORT TERM GOAL #2   Title  Patient will ambuate WBAT on R in hinged brace with single axillary crutch or LRAD     Status  Achieved      PT SHORT TERM GOAL #3   Title  R knee AROM >/= 5-90 degrees    Status  Achieved        PT Long Term Goals - 06/14/18 1332      PT LONG TERM GOAL #1   Title  Independent with ongoing/advanced HEP    Status  Partially Met    Target Date  07/12/18      PT LONG TERM GOAL #2   Title  R knee AROM >/= 0-130 dg to allow for normal gait and stair negotiation    Status  Achieved      PT LONG TERM GOAL #3   Title  R hip and knee strength >/= 4+/5 for improved stability    Status  Partially Met    Target Date  07/12/18      PT LONG TERM GOAL #4   Title  Patient will ambuate with normal gait pattern w/o AD    Status  Achieved      PT LONG TERM GOAL #5   Title  Patient will report ability to complete ADLs and light household chores w/o restriction due to R knee pain, LOM or weakness    Status  Achieved      PT LONG TERM GOAL #6   Title  Patient will ascend/descend stairs with good reciprocal pattern w/o evidence of quad instability    Status  Partially Met    Target Date  07/12/18      PT LONG TERM GOAL #7   Title  Patient will be able to transfer to/from floor bearing weight on R knee as needed to 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 She reports increasing ambulation tolerance but still notes  need to rest and elevate her leg after longer walks. She reports increased comfort with reciprocal stair negotiation without UE support however increased effort noted with R foot leading on ascent and still with limited eccentric quad control on R during descent. She has yet to return to work due to COVID-19 restrictions but anticipates the daycare reopening as of May 8th - with simulated work activities, she is able to stand on tip-toes to reach over crib rail to place simulated baby in crib and transition from standing to floor with simulated baby but still lacks confidence with floor to stand transfers while holding a simulated baby. Strength testing reveals ongoing mild strength deficits in R hip and knee as compared to left which continues to impact aforementioned functional tasks, despite good compliance with HEP. R knee pain still an issue intermittently without consistent trigger. All STGs met along with LTGs #2, 4 & 5; remaining LTGs at least partially met. Given upcoming anticipated return to work on 06/24/18 with ongoing weakness and functional limitations as previously detailed as well as intermittent flare-ups of pain, Jacqulynn would like to continue with PT 1x/wk for 4 more weeks to address remain deficits and facilitate return to work and normal daily tasks.    Rehab Potential  Good    Clinical Impairments Affecting Rehab Potential  B sensorineural hearing loss - cochlear implant & pt reads lips    PT Frequency  1x / week    PT Duration  4 weeks    PT Treatment/Interventions  Patient/family education;Therapeutic exercise;Therapeutic activities;Functional mobility training;Gait training;Stair training;DME Instruction;Neuromuscular re-education;Balance training;Manual techniques;Passive range of motion;Taping;Dry needling;Cryotherapy;Vasopneumatic Device;Moist Heat;Electrical Stimulation;Iontophoresis 37m/ml Dexamethasone;ADLs/Self Care Home Management    PT Next Visit Plan  Progressive R hip and  knee strengthening with hip rotator, hamstring and eccentric quad emphasis as tolerated; Stair training focusing on eccentric control to normalize step pattern; Work simulation - floor transfers; Manual therapy & modalties PRN for pain/edema    Consulted and Agree with Plan of Care  Patient       Patient will benefit from skilled therapeutic intervention in order to improve the following deficits and impairments:  Pain, Decreased range of motion, Impaired flexibility, Increased muscle spasms, Decreased strength, Difficulty walking, Abnormal gait, Decreased activity tolerance, Decreased balance, Decreased endurance, Postural dysfunction, Improper body mechanics  Visit Diagnosis: Acute pain of right knee  Stiffness of right knee, not elsewhere classified  Muscle weakness (generalized)  Other abnormalities of gait and mobility  Difficulty in walking, not elsewhere classified     Problem List Patient Active Problem List   Diagnosis Date Noted  . Sensorineural hearing loss, bilateral 06/28/2014  . Osteoporosis 02/21/2014  . Nephrolithiasis 11/02/2013  . Hyperlipidemia 03/07/2013  . Calculus of ureter 02/14/2013  . Essential hypertension 02/01/2013  . Gastro-esophageal reflux disease without esophagitis 08/01/2012  . Irritable bowel syndrome without diarrhea 08/01/2012  . Migraine, unspecified, not intractable, without status migrainosus 12/31/2011    JPercival Spanish PT, MPT 06/14/2018, 2:50 PM  [facility] [address]  [place], [address] Phone: [phone]   Fax:  [phone]  Name: Luke Rigsbee MRN: [id number] Date of Birth: September 08, 1960

## 2018-06-17 ENCOUNTER — Ambulatory Visit: Admitting: Physical Therapy

## 2018-06-21 ENCOUNTER — Other Ambulatory Visit: Payer: Self-pay

## 2018-06-21 ENCOUNTER — Ambulatory Visit: Attending: Family Medicine | Admitting: Physical Therapy

## 2018-06-21 ENCOUNTER — Encounter: Payer: Self-pay | Admitting: Physical Therapy

## 2018-06-21 DIAGNOSIS — M6281 Muscle weakness (generalized): Secondary | ICD-10-CM | POA: Diagnosis present

## 2018-06-21 DIAGNOSIS — R262 Difficulty in walking, not elsewhere classified: Secondary | ICD-10-CM

## 2018-06-21 DIAGNOSIS — M25661 Stiffness of right knee, not elsewhere classified: Secondary | ICD-10-CM | POA: Insufficient documentation

## 2018-06-21 DIAGNOSIS — M25561 Pain in right knee: Secondary | ICD-10-CM | POA: Diagnosis not present

## 2018-06-21 DIAGNOSIS — R2689 Other abnormalities of gait and mobility: Secondary | ICD-10-CM | POA: Insufficient documentation

## 2018-06-21 NOTE — Therapy (Signed)
[facility] [address]  [place], [place], [address] Phone: [phone]   Fax:  [phone]  Physical Therapy Treatment  Patient Details  Name: Joan Horn MRN: [id number] Date of Birth: 02-May-1960 Referring Provider (PT): Karlton Lemon, MD   Encounter Date: 06/21/2018  PT End of Session - 06/21/18 1310    Visit Number  27    Number of Visits  30    Date for PT Re-Evaluation  07/12/18    Authorization Type  [employer] - PT only    PT Start Time  1310    PT Stop Time  5462    PT Time Calculation (min)  48 min    Activity Tolerance  Patient tolerated treatment well;No increased pain    Behavior During Therapy  WFL for tasks assessed/performed       Past Medical History:  Diagnosis Date  . GERD (gastroesophageal reflux disease)   . Hearing impaired   . Kidney stone     Past Surgical History:  Procedure Laterality Date  . ABDOMINAL HYSTERECTOMY      There were no vitals filed for this visit.  Subjective Assessment - 06/21/18 1312    Subjective  Pt reports she will returning to work May 26.    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    Patient Stated Goals  "to get off the crutches and walk"    Currently in Pain?  No/denies                       [facility] Adult PT Treatment/Exercise - 06/21/18 1310      Exercises   Exercises  Knee/Hip      Knee/Hip Exercises: Aerobic   Recumbent Bike  L3 x 7 min (pillow behind back)      Knee/Hip Exercises: Machines for Strengthening   Cybex Knee Extension  B con/R ecc 10# x15    Cybex Knee Flexion  B con/R ecc 15# x15      Knee/Hip Exercises: Standing   Step Down  Right;15 reps;Step Height: 6";Hand Hold: 1    Step Down Limitations  lateral & forward eccentric lowering with light  heel touch, cues for level pelvis & avoiding knee fwd of toes    Rocker Board Limitations  Inverted BOSU - mini-squat x 15 - intermittent UE support from PT    SLS  R SLS on inverted BOSU 3 x 20 sec; intermittent UE support from PT      Knee/Hip Exercises: Supine   Single Leg Bridge  Right;Left;15 reps;Strengthening   alternating LE   Other Supine Knee/Hip Exercises  Bridge + HS curl with heels on peanut ball x 15      Knee/Hip Exercises: Sidelying   Other Sidelying Knee/Hip Exercises  R/L side plank elbows to feet 15 x 5 sec each               PT Short Term Goals - 03/29/18 1501      PT SHORT TERM GOAL #1   Title  Independent with initial HEP    Status  Achieved      PT SHORT TERM GOAL #2   Title  Patient will ambuate WBAT on R in hinged brace with single axillary crutch or LRAD     Status  Achieved  PT SHORT TERM GOAL #3   Title  R knee AROM >/= 5-90 degrees    Status  Achieved        PT Long Term Goals - 06/14/18 1332      PT LONG TERM GOAL #1   Title  Independent with ongoing/advanced HEP    Status  Partially Met    Target Date  07/12/18      PT LONG TERM GOAL #2   Title  R knee AROM >/= 0-130 dg to allow for normal gait and stair negotiation    Status  Achieved      PT LONG TERM GOAL #3   Title  R hip and knee strength >/= 4+/5 for improved stability    Status  Partially Met    Target Date  07/12/18      PT LONG TERM GOAL #4   Title  Patient will ambuate with normal gait pattern w/o AD    Status  Achieved      PT LONG TERM GOAL #5   Title  Patient will report ability to complete ADLs and light household chores w/o restriction due to R knee pain, LOM or weakness    Status  Achieved      PT LONG TERM GOAL #6   Title  Patient will ascend/descend stairs with good reciprocal pattern w/o evidence of quad instability    Status  Partially Met    Target Date  07/12/18      PT LONG TERM GOAL #7   Title  Patient will be able to transfer to/from floor  bearing weight on R knee as 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 HEP going well with most exercises being completed with green TB at present. Treatment today focusing on progression of current exercises with emphasis on increased unilateral R LE strengthening and control/balance. Pt noting some mild discomfort at ~1/10 with a few exercises but able to complete all exercises as directed.     Rehab Potential  Good    Clinical Impairments Affecting Rehab Potential  B sensorineural hearing loss - cochlear implant & pt reads lips    PT Frequency  1x / week    PT Duration  4 weeks    PT Treatment/Interventions  Patient/family education;Therapeutic exercise;Therapeutic activities;Functional mobility training;Gait training;Stair training;DME Instruction;Neuromuscular re-education;Balance training;Manual techniques;Passive range of motion;Taping;Dry needling;Cryotherapy;Vasopneumatic Device;Moist Heat;Electrical Stimulation;Iontophoresis 50m/ml Dexamethasone;ADLs/Self Care Home Management    PT Next Visit Plan  Progressive R hip and knee strengthening with hip rotator, hamstring and eccentric quad emphasis as tolerated; Stair training focusing on eccentric control to normalize step pattern; Work simulation - floor transfers; Manual therapy & modalties PRN for pain/edema    Consulted and Agree with Plan of Care  Patient       Patient will benefit from skilled therapeutic intervention in order to improve the following deficits and impairments:  Pain, Decreased range of motion, Impaired flexibility, Increased muscle spasms, Decreased strength, Difficulty walking, Abnormal gait, Decreased activity tolerance, Decreased balance, Decreased endurance, Postural dysfunction, Improper  body mechanics  Visit Diagnosis: Acute pain of right knee  Stiffness of right knee, not elsewhere classified  Muscle weakness (generalized)  Other abnormalities  of gait and mobility  Difficulty in walking, not elsewhere classified     Problem List Patient Active Problem List   Diagnosis Date Noted  . Sensorineural hearing loss, bilateral 06/28/2014  . Osteoporosis 02/21/2014  . Nephrolithiasis 11/02/2013  . Hyperlipidemia 03/07/2013  . Calculus of ureter 02/14/2013  . Essential hypertension 02/01/2013  . Gastro-esophageal reflux disease without esophagitis 08/01/2012  . Irritable bowel syndrome without diarrhea 08/01/2012  . Migraine, unspecified, not intractable, without status migrainosus 12/31/2011    Percival Spanish, PT, MPT 06/21/2018, 3:44 PM  [facility] [address]  [place], [place], [address] Phone: [phone]   Fax:  [phone]  Name: Rynlee Lisbon MRN: [id number] Date of Birth: 02-Aug-1960

## 2018-06-24 ENCOUNTER — Encounter: Admitting: Physical Therapy

## 2018-06-28 ENCOUNTER — Ambulatory Visit: Admitting: Physical Therapy

## 2018-06-28 ENCOUNTER — Encounter: Payer: Self-pay | Admitting: Physical Therapy

## 2018-06-28 ENCOUNTER — Other Ambulatory Visit: Payer: Self-pay

## 2018-06-28 DIAGNOSIS — M25561 Pain in right knee: Secondary | ICD-10-CM

## 2018-06-28 DIAGNOSIS — M25661 Stiffness of right knee, not elsewhere classified: Secondary | ICD-10-CM

## 2018-06-28 DIAGNOSIS — M6281 Muscle weakness (generalized): Secondary | ICD-10-CM

## 2018-06-28 DIAGNOSIS — R262 Difficulty in walking, not elsewhere classified: Secondary | ICD-10-CM

## 2018-06-28 DIAGNOSIS — R2689 Other abnormalities of gait and mobility: Secondary | ICD-10-CM

## 2018-06-28 NOTE — Patient Instructions (Addendum)
IONTOPHORESIS PATIENT PRECAUTIONS & CONTRAINDICATIONS:  . Redness under one or both electrodes can occur.  This characterized by a uniform redness that usually disappears within 12 hours of treatment. . Small pinhead size blisters may result in response to the drug.  Contact your physician if the problem persists more than 24 hours. . On rare occasions, iontophoresis therapy can result in temporary skin reactions such as rash, inflammation, irritation or burns.  The skin reactions may be the result of individual sensitivity to the ionic solution used, the condition of the skin at the start of treatment, reaction to the materials in the electrodes, allergies or sensitivity to dexamethasone, or a poor connection between the patch and your skin.  Discontinue using iontophoresis if you have any of these reactions and report to your therapist. . Remove the Patch or electrodes if you have any undue sensation of pain or burning during the treatment and report discomfort to your therapist. . Tell your Therapist if you have had known adverse reactions to the application of electrical current. . Approximate treatment time is 4-6 hours.  Remove the patch after 6 hours. . The Patch can be worn during normal activity, however excessive motion where the electrodes have been placed can cause poor contact between the skin and the electrode or uneven electrical current resulting in greater risk of skin irritation. . Keep out of the reach of children.   . DO NOT use if you have a cardiac pacemaker or any other electrically sensitive implanted device. . DO NOT use if you have a known sensitivity to dexamethasone. . DO NOT use during Magnetic Resonance Imaging (MRI). . DO NOT use over broken or compromised skin (e.g. sunburn, cuts, or acne) due to the increased risk of skin reaction. . DO NOT SHAVE over the area to be treated:  To establish good contact between the Patch and the skin, excessive hair may be  clipped. . DO NOT place the Patch or electrodes on or over your eyes, directly over your heart, or brain. . DO NOT reuse the Patch or electrodes as this may cause burns to occur.  

## 2018-06-28 NOTE — Therapy (Signed)
[facility] [address]  [place], [place], [address] Phone: [phone]   Fax:  [phone]  Physical Therapy Treatment  Patient Details  Name: Joan Horn MRN: [id number] Date of Birth: [birth year] Referring Provider (PT): Karlton Lemon, MD   Encounter Date: 06/28/2018  PT End of Session - 06/28/18 1321    Visit Number  28    Number of Visits  30    Date for PT Re-Evaluation  07/12/18    Authorization Type  [employer] - PT only    PT Start Time  1321    PT Stop Time  1404    PT Time Calculation (min)  43 min    Activity Tolerance  Patient tolerated treatment well    Behavior During Therapy  [facility] for tasks assessed/performed       Past Medical History:  Diagnosis Date  . GERD (gastroesophageal reflux disease)   . Hearing impaired   . Kidney stone     Past Surgical History:  Procedure Laterality Date  . ABDOMINAL HYSTERECTOMY      There were no vitals filed for this visit.  Subjective Assessment - 06/28/18 1329    Subjective  Pt reports 2 days ago she landed hard on her R leg twisting her knee and had some pain at the time, but now resolved.    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    Patient Stated Goals  "to get off the crutches and walk"    Currently in Pain?  No/denies                       [facility] Adult PT Treatment/Exercise - 06/28/18 1321      Knee/Hip Exercises: Stretches   Other Knee/Hip Stretches  R hip adductor stretches x 30 sec each - standing & seated lateral lunge position, seated & supine frog/butterfly stretch - HEP instructions provided for preferred versions      Knee/Hip Exercises: Aerobic   Recumbent Bike  L3 x 7 min (pillow behind back)      Knee/Hip Exercises: Standing   Functional Squat  10  reps    Functional Squat Limitations  eccentric sit-downs to box + Airex pad - good tolerance until attempted to reverse squat to standing w/o full weight acceptance on seat at which time pt c/o medial knee pain - unable to proceed w/o pain despite modification including adding medial or lateral isometircs with ball or green TB, therefore deferred further attemtps      Modalities   Modalities  Iontophoresis      Iontophoresis   Type of Iontophoresis  Dexamethasone    Location  R pes anserine    Dose  80 mA-min, 1.0 mL    Time  4-6 hr patch (#1 of 6)      Manual Therapy   Manual Therapy  Soft tissue mobilization;Myofascial release;Other (comment)    Soft tissue mobilization  STM to R hip adductors and pes anserine muscles (sartorius, gracilis and semitendinosus) - greatest tendereness in gracilis and medial HS    Myofascial Release  manual TPR to gracilis     Other Manual Therapy  Reviewed how to perform self-STM to HS, ITB & hip adductors using rolling pin as pt reporting only using rolling pin for quads.  PT Education - 06/28/18 1404    Education Details  Ionto patch instructions; HEP update - hip adductor stretches    Person(s) Educated  Patient    Methods  Explanation;Demonstration;Handout    Comprehension  Verbalized understanding;Returned demonstration       PT Short Term Goals - 03/29/18 1501      PT SHORT TERM GOAL #1   Title  Independent with initial HEP    Status  Achieved      PT SHORT TERM GOAL #2   Title  Patient will ambuate WBAT on R in hinged brace with single axillary crutch or LRAD     Status  Achieved      PT SHORT TERM GOAL #3   Title  R knee AROM >/= 5-90 degrees    Status  Achieved        PT Long Term Goals - 06/14/18 1332      PT LONG TERM GOAL #1   Title  Independent with ongoing/advanced HEP    Status  Partially Met    Target Date  07/12/18      PT LONG TERM GOAL #2   Title  R knee AROM >/= 0-130 dg to allow for normal gait  and stair negotiation    Status  Achieved      PT LONG TERM GOAL #3   Title  R hip and knee strength >/= 4+/5 for improved stability    Status  Partially Met    Target Date  07/12/18      PT LONG TERM GOAL #4   Title  Patient will ambuate with normal gait pattern w/o AD    Status  Achieved      PT LONG TERM GOAL #5   Title  Patient will report ability to complete ADLs and light household chores w/o restriction due to R knee pain, LOM or weakness    Status  Achieved      PT LONG TERM GOAL #6   Title  Patient will ascend/descend stairs with good reciprocal pattern w/o evidence of quad instability    Status  Partially Met    Target Date  07/12/18      PT LONG TERM GOAL #7   Title  Patient will be able to transfer to/from floor bearing 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 Pain appears localized along medial joint line of R knee along with swelling and ttp in R pes anserine and mid belly of gracilis. Some improvement with manual STM and TPR by PT but not fully resolved, therefore reviewed use of rolling pin for self-STM at home as well as stretching for gracilis/hip adductors. Ionto patch applied at pes anserine to address pain/inflammation present here.    Rehab Potential  Good    Clinical Impairments Affecting Rehab Potential  B sensorineural hearing loss - cochlear implant & pt reads lips    PT Frequency  1x / week    PT Duration  4 weeks    PT Treatment/Interventions  Patient/family education;Therapeutic exercise;Therapeutic activities;Functional mobility training;Gait training;Stair training;DME Instruction;Neuromuscular re-education;Balance training;Manual  techniques;Passive range of motion;Taping;Dry needling;Cryotherapy;Vasopneumatic Device;Moist Heat;Electrical Stimulation;Iontophoresis 20m/ml Dexamethasone;ADLs/Self Care Home Management    PT Next Visit Plan  Assess response to ionto; Progressive R hip and knee strengthening with hip rotator, hamstring and eccentric quad emphasis as tolerated; Stair training focusing on eccentric control to normalize step pattern; Work simulation - floor transfers; Manual therapy & modalties PRN for pain/edema    Consulted and Agree with Plan of Care  Patient       Patient will benefit from skilled therapeutic intervention in order to improve the following deficits and impairments:  Pain, Decreased range of motion, Impaired flexibility, Increased muscle spasms, Decreased strength, Difficulty walking, Abnormal gait, Decreased activity tolerance, Decreased balance, Decreased endurance, Postural dysfunction, Improper body mechanics  Visit Diagnosis: Acute pain of right knee  Stiffness of right knee, not elsewhere classified  Muscle weakness (generalized)  Other abnormalities of gait and mobility  Difficulty in walking, not elsewhere classified     Problem List Patient Active Problem List   Diagnosis Date Noted  . Sensorineural hearing loss, bilateral 06/28/2014  . Osteoporosis 02/21/2014  . Nephrolithiasis 11/02/2013  . Hyperlipidemia 03/07/2013  . Calculus of ureter 02/14/2013  . Essential hypertension 02/01/2013  . Gastro-esophageal reflux disease without esophagitis 08/01/2012  . Irritable bowel syndrome without diarrhea 08/01/2012  . Migraine, unspecified, not intractable, without status migrainosus 12/31/2011    JPercival Spanish PT, MPT 06/28/2018, 4:50 PM  [facility] [place] [address]Phone: [phone]  Fax:  [phone] Name: KAndora KrullMRN: [id number] of Birth: 4Dec 05, 1962

## 2018-07-01 ENCOUNTER — Encounter: Admitting: Physical Therapy

## 2018-07-05 ENCOUNTER — Other Ambulatory Visit: Payer: Self-pay

## 2018-07-05 ENCOUNTER — Encounter: Payer: Self-pay | Admitting: Physical Therapy

## 2018-07-05 ENCOUNTER — Ambulatory Visit: Admitting: Physical Therapy

## 2018-07-05 DIAGNOSIS — M25661 Stiffness of right knee, not elsewhere classified: Secondary | ICD-10-CM

## 2018-07-05 DIAGNOSIS — M6281 Muscle weakness (generalized): Secondary | ICD-10-CM

## 2018-07-05 DIAGNOSIS — R262 Difficulty in walking, not elsewhere classified: Secondary | ICD-10-CM

## 2018-07-05 DIAGNOSIS — M25561 Pain in right knee: Secondary | ICD-10-CM

## 2018-07-05 DIAGNOSIS — R2689 Other abnormalities of gait and mobility: Secondary | ICD-10-CM

## 2018-07-05 NOTE — Therapy (Signed)
[facility] [address]  [place], [place], [address] Phone: [phone]   Fax:  [phone]  Physical Therapy Treatment  Patient Details  Name: Joan Horn MRN: [id number] Date of Birth: 09/08/1960 Referring Provider (PT): Karlton Lemon, MD   Encounter Date: 07/05/2018  PT End of Session - 07/05/18 1252    Visit Number  29    Number of Visits  30    Date for PT Re-Evaluation  07/12/18    Authorization Type  [employer] - PT only    PT Start Time  1252    PT Stop Time  1350    PT Time Calculation (min)  58 min    Activity Tolerance  Patient tolerated treatment well    Behavior During Therapy  [facility] for tasks assessed/performed       Past Medical History:  Diagnosis Date  . GERD (gastroesophageal reflux disease)   . Hearing impaired   . Kidney stone     Past Surgical History:  Procedure Laterality Date  . ABDOMINAL HYSTERECTOMY      There were no vitals filed for this visit.  Subjective Assessment - 07/05/18 1254    Subjective  Pt reports her knee pain has been up and down for the past week - more "annoying" than very "painful".     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    Patient Stated Goals  "to get off the crutches and walk"    Currently in Pain?  Yes    Pain Score  3     Pain Location  Knee    Pain Orientation  Right    Pain Descriptors / Indicators  --   "annoying"   Pain Type  Acute pain    Pain Frequency  Intermittent                       [facility] Adult PT Treatment/Exercise - 07/05/18 1252      Exercises   Exercises  Knee/Hip      Knee/Hip Exercises: Stretches   Passive Hamstring Stretch  Right;Left;30 seconds;1 rep    Passive Hamstring Stretch Limitations  seated hip hinge with foot on stool    ITB  Stretch  Right;30 seconds;1 rep    ITB Stretch Limitations  lateral lean over back of chair     Gastroc Stretch  Right;30 seconds;2 reps    Gastroc Stretch Limitations  leaning into wall    Soleus Stretch  Right;30 seconds;1 rep    Soleus Stretch Limitations  leaning into wall    Other Knee/Hip Stretches  R hip adductor stretches x 30 sec each - seated lateral lunge position, supine frog/butterfly stretch      Knee/Hip Exercises: Aerobic   Recumbent Bike  L2 x 7 min (pillow behind back)      Knee/Hip Exercises: Standing   Hip ADduction  Right;10 reps;2 sets;Strengthening    Hip ADduction Limitations  green TB at ankle, 1 pole A    Wall Squat  5 reps    Wall Squat Limitations  + hip adduction ball squeeze (deferred d/t medial knee pain)      Knee/Hip Exercises: Seated   Long Arc Quad  Right;10 reps;2 sets;Strengthening    Long Arc Quad Limitations  red TB    Heel Slides  Right;10 reps;2 sets;Strengthening    Heel Slides Limitations  red TB      Knee/Hip Exercises: Supine   Bridges with Clamshell  Both;15 reps;Strengthening   alt hip ABD/ER with red TB     Knee/Hip Exercises: Sidelying   Clams  R/L clam with green TB in side plank elbow to knees x 15             PT Education - 07/05/18 1350    Education Details  HEP review/update    Person(s) Educated  Patient    Methods  Explanation;Demonstration;Handout    Comprehension  Verbalized understanding;Returned demonstration       PT Short Term Goals - 03/29/18 1501      PT SHORT TERM GOAL #1   Title  Independent with initial HEP    Status  Achieved      PT SHORT TERM GOAL #2   Title  Patient will ambuate WBAT on R in hinged brace with single axillary crutch or LRAD     Status  Achieved      PT SHORT TERM GOAL #3   Title  R knee AROM >/= 5-90 degrees    Status  Achieved        PT Long Term Goals - 06/14/18 1332      PT LONG TERM GOAL #1   Title  Independent with ongoing/advanced HEP    Status  Partially Met     Target Date  07/12/18      PT LONG TERM GOAL #2   Title  R knee AROM >/= 0-130 dg to allow for normal gait and stair negotiation    Status  Achieved      PT LONG TERM GOAL #3   Title  R hip and knee strength >/= 4+/5 for improved stability    Status  Partially Met    Target Date  07/12/18      PT LONG TERM GOAL #4   Title  Patient will ambuate with normal gait pattern w/o AD    Status  Achieved      PT LONG TERM GOAL #5   Title  Patient will report ability to complete ADLs and light household chores w/o restriction due to R knee pain, LOM or weakness    Status  Achieved      PT LONG TERM GOAL #6   Title  Patient will ascend/descend stairs with good reciprocal pattern w/o evidence of quad instability    Status  Partially Met    Target Date  07/12/18      PT LONG TERM GOAL #7   Title  Patient will be able to transfer to/from floor bearing 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 Treatment session today focusing on review/update of HEP emphasizing ongoing daily stretching with strengthening exercises ~3x/wk to allow for muscles to recover on days in between. Will plan to assess how she manages return to work as of next visit and determine need for recert vs readiness for transition to HEP with anticipated 30-day hold.  Rehab Potential  Good    Clinical Impairments Affecting Rehab Potential  B sensorineural hearing loss - cochlear implant & pt reads lips    PT Frequency  1x / week    PT Duration  4 weeks    PT Treatment/Interventions  Patient/family education;Therapeutic exercise;Therapeutic  activities;Functional mobility training;Gait training;Stair training;DME Instruction;Neuromuscular re-education;Balance training;Manual techniques;Passive range of motion;Taping;Dry needling;Cryotherapy;Vasopneumatic Device;Moist Heat;Electrical Stimulation;Iontophoresis 57m/ml Dexamethasone;ADLs/Self Care Home Management    PT Next Visit Plan  Review HEP as needed (?add single leg bridge); Recert vs discharge assessment - probable 30-day hold    Consulted and Agree with Plan of Care  Patient       Patient will benefit from skilled therapeutic intervention in order to improve the following deficits and impairments:  Pain, Decreased range of motion, Impaired flexibility, Increased muscle spasms, Decreased strength, Difficulty walking, Abnormal gait, Decreased activity tolerance, Decreased balance, Decreased endurance, Postural dysfunction, Improper body mechanics  Visit Diagnosis: Acute pain of right knee  Stiffness of right knee, not elsewhere classified  Muscle weakness (generalized)  Other abnormalities of gait and mobility  Difficulty in walking, not elsewhere classified     Problem List Patient Active Problem List   Diagnosis Date Noted  . Sensorineural hearing loss, bilateral 06/28/2014  . Osteoporosis 02/21/2014  . Nephrolithiasis 11/02/2013  . Hyperlipidemia 03/07/2013  . Calculus of ureter 02/14/2013  . Essential hypertension 02/01/2013  . Gastro-esophageal reflux disease without esophagitis 08/01/2012  . Irritable bowel syndrome without diarrhea 08/01/2012  . Migraine, unspecified, not intractable, without status migrainosus 12/31/2011    JPercival Spanish PT, MPT 07/05/2018, 3:48 PM  [facility] [place]red[place] [address]Phone: [phone]  Fax:  [phone] Name: KLatrese CarolanMRN: [id number] of Birth: 405/11/1960

## 2018-07-12 ENCOUNTER — Encounter: Admitting: Physical Therapy

## 2018-07-12 ENCOUNTER — Other Ambulatory Visit: Payer: Self-pay

## 2018-07-12 ENCOUNTER — Encounter: Payer: Self-pay | Admitting: Physical Therapy

## 2018-07-12 ENCOUNTER — Ambulatory Visit: Admitting: Physical Therapy

## 2018-07-12 DIAGNOSIS — M25561 Pain in right knee: Secondary | ICD-10-CM

## 2018-07-12 DIAGNOSIS — M25661 Stiffness of right knee, not elsewhere classified: Secondary | ICD-10-CM

## 2018-07-12 DIAGNOSIS — R2689 Other abnormalities of gait and mobility: Secondary | ICD-10-CM

## 2018-07-12 DIAGNOSIS — R262 Difficulty in walking, not elsewhere classified: Secondary | ICD-10-CM

## 2018-07-12 DIAGNOSIS — M6281 Muscle weakness (generalized): Secondary | ICD-10-CM

## 2018-07-12 NOTE — Therapy (Addendum)
[facility] [address]  [place], [place], [address] Phone: [phone]   Fax:  [phone]  Physical Therapy Treatment / Discharge Summary  Patient Details  Name: Joan Horn MRN: [id number] Date of Birth: June 14, 1960 Referring Provider (PT): Karlton Lemon, MD   Encounter Date: 07/12/2018  PT End of Session - 07/12/18 0800    Visit Number  30    Number of Visits  30    Date for PT Re-Evaluation  07/12/18    Authorization Type  [employer] - PT only    PT Start Time  0800    PT Stop Time  0847    PT Time Calculation (min)  47 min    Activity Tolerance  Patient tolerated treatment well    Behavior During Therapy  [facility] for tasks assessed/performed       Past Medical History:  Diagnosis Date  . GERD (gastroesophageal reflux disease)   . Hearing impaired   . Kidney stone     Past Surgical History:  Procedure Laterality Date  . ABDOMINAL HYSTERECTOMY      There were no vitals filed for this visit.  Subjective Assessment - 07/12/18 0803    Subjective  Pt reporting a slip on a wheeled toy resulting in a near fall at work last week - pt states seh was sore for 2 days following this.    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  2 miles    Diagnostic tests  02/25/18 R knee x-ray: Healing patellar fracture.    Patient Stated Goals  "to get off the crutches and walk"    Currently in Pain?  Yes    Pain Score  3     Pain Location  Knee    Pain Orientation  Right    Pain Descriptors / Indicators  Dull    Pain Type  Acute pain    Pain Frequency  Intermittent         [facility] PT Assessment - 07/12/18 0800      Assessment   Medical Diagnosis  R patellar fracture    Referring Provider (PT)  Karlton Lemon, MD    Onset Date/Surgical Date  01/14/18    Next MD Visit  PRN       Observation/Other Assessments   Focus on Therapeutic Outcomes (FOTO)   Knee - 57% (43% limitation)      AROM   Right Knee Extension  -2    Right Knee Flexion  132      Strength   Right Hip Flexion  5/5    Right Hip Extension  4+/5    Right Hip External Rotation   4/5    Right Hip Internal Rotation  4+/5    Right Hip ABduction  4+/5    Right Hip ADduction  4/5    Left Hip Flexion  5/5    Left Hip Extension  4+/5    Left Hip External Rotation  4+/5    Left Hip Internal Rotation  5/5    Left Hip ABduction  5/5    Left Hip ADduction  4+/5    Right Knee Flexion  4+/5    Right Knee Extension  4+/5    Left Knee Flexion  5/5    Left Knee Extension  5/5  [facility] PT Treatment/Exercise - 07/12/18 0800      Exercises   Exercises  Knee/Hip      Knee/Hip Exercises: Aerobic   Recumbent Bike  L3 x 6 min (pillow behind back)      Knee/Hip Exercises: Seated   Long Arc Quad  Right;10 reps;2 sets;Strengthening    Long Arc Quad Limitations  red TB      Knee/Hip Exercises: Supine   Single Leg Bridge  Right;Left;15 reps;Strengthening   alternating LE            PT Education - 07/12/18 0845    Education Details  Final HEP review/update    Person(s) Educated  Patient    Methods  Explanation;Demonstration;Handout    Comprehension  Verbalized understanding;Returned demonstration       PT Short Term Goals - 03/29/18 1501      PT SHORT TERM GOAL #1   Title  Independent with initial HEP    Status  Achieved      PT SHORT TERM GOAL #2   Title  Patient will ambuate WBAT on R in hinged brace with single axillary crutch or LRAD     Status  Achieved      PT SHORT TERM GOAL #3   Title  R knee AROM >/= 5-90 degrees    Status  Achieved        PT Long Term Goals - 07/12/18 0817      PT LONG TERM GOAL #1   Title  Independent with ongoing/advanced HEP    Status  Achieved      PT LONG TERM GOAL #2   Title  R knee AROM >/= 0-130 dg to allow for  normal gait and stair negotiation    Status  Achieved      PT LONG TERM GOAL #3   Title  R hip and knee strength >/= 4+/5 for improved stability    Status  Partially Met      PT LONG TERM GOAL #4   Title  Patient will ambuate with normal gait pattern w/o AD    Status  Achieved      PT LONG TERM GOAL #5   Title  Patient will report ability to complete ADLs and light household chores w/o restriction due to R knee pain, LOM or weakness    Status  Achieved      PT LONG TERM GOAL #6   Title  Patient will ascend/descend stairs with good reciprocal pattern w/o evidence of quad instability    Status  Partially Met      PT LONG TERM GOAL #7   Title  Patient will be able to transfer to/from floor bearing weight on R knee as needed 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 Still having some soreness from the incident at work but feels that it has been improving with stretching and self-STM with rolling pin. R knee AROM WFL although still limited as compared to L. Mild weakness still evident in proximal LE musculature but more symmetrical and patient feels confident with ongoing HEP. Functionally patient able to complete floor transfers to allow her to get down on the floor  with the babies at work. She is able to navigate stairs reciprocally but still demonstrates slight hesitation on descent with R LE due to eccentric quad weakness. LTGs mostly met with strength and stair negotiation goals only partially met. Joan Horn feels confident proceeding with transition to HEP as planned today. Will place her on hold for 30 days in the event that further issues arise as she settles back into work and normal daily activites.    Rehab Potential  Good    Clinical Impairments  Affecting Rehab Potential  B sensorineural hearing loss - cochlear implant & pt reads lips    PT Treatment/Interventions  Patient/family education;Therapeutic exercise;Therapeutic activities;Functional mobility training;Gait training;Stair training;DME Instruction;Neuromuscular re-education;Balance training;Manual techniques;Passive range of motion;Taping;Dry needling;Cryotherapy;Vasopneumatic Device;Moist Heat;Electrical Stimulation;Iontophoresis 43m/ml Dexamethasone;ADLs/Self Care Home Management    PT Next Visit Plan  30-day hold    Consulted and Agree with Plan of Care  Patient       Patient will benefit from skilled therapeutic intervention in order to improve the following deficits and impairments:  Pain, Decreased range of motion, Impaired flexibility, Increased muscle spasms, Decreased strength, Difficulty walking, Abnormal gait, Decreased activity tolerance, Decreased balance, Decreased endurance, Postural dysfunction, Improper body mechanics  Visit Diagnosis: Acute pain of right knee  Stiffness of right knee, not elsewhere classified  Muscle weakness (generalized)  Other abnormalities of gait and mobility  Difficulty in walking, not elsewhere classified     Problem List Patient Active Problem List   Diagnosis Date Noted  . Sensorineural hearing loss, bilateral 06/28/2014  . Osteoporosis 02/21/2014  . Nephrolithiasis 11/02/2013  . Hyperlipidemia 03/07/2013  . Calculus of ureter 02/14/2013  . Essential hypertension 02/01/2013  . Gastro-esophageal reflux disease without esophagitis 08/01/2012  . Irritable bowel syndrome without diarrhea 08/01/2012  . Migraine, unspecified, not intractable, without status migrainosus 12/31/2011    JPercival Spanish PT, MPT 07/12/2018, 10:34 AM  [facility] [place] [address]Phone: [phone]  Fax:  [phone] Name: KGwynn ChalkerMRN:  [id number] of Birth: [birth year] PHYSICAL THERAPY DISCHARGE SUMMARY  Visits from Start of Care: 30  Current functional level related to goals / functional outcomes:   Refer to above clinical impression for status as of last visit on 07/12/2018. Patient was placed on hold for 30 days and has not needed to return to PT, therefore will proceed with discharge from PT for this episode.   Remaining deficits:   As above.   Education / Equipment:   HEP  Plan: Patient agrees to discharge.  Patient goals were met. Patient is being discharged due to meeting the stated rehab goals.  ?????     JPercival Spanish PT, MPT 08/12/18, 9:18 AM  [facility] [place] [address]Phone: [phone]  Fax:  [phone]

## 2018-12-20 DIAGNOSIS — D582 Other hemoglobinopathies: Secondary | ICD-10-CM | POA: Insufficient documentation

## 2019-08-17 ENCOUNTER — Encounter (HOSPITAL_BASED_OUTPATIENT_CLINIC_OR_DEPARTMENT_OTHER): Payer: Self-pay | Admitting: Emergency Medicine

## 2019-08-17 ENCOUNTER — Other Ambulatory Visit: Payer: Self-pay

## 2019-08-17 ENCOUNTER — Emergency Department (HOSPITAL_BASED_OUTPATIENT_CLINIC_OR_DEPARTMENT_OTHER)
Admission: EM | Admit: 2019-08-17 | Discharge: 2019-08-17 | Disposition: A | Discharge status: Home / Self Care | Attending: Emergency Medicine | Admitting: Emergency Medicine

## 2019-08-17 DIAGNOSIS — I10 Essential (primary) hypertension: Secondary | ICD-10-CM | POA: Diagnosis not present

## 2019-08-17 DIAGNOSIS — N3001 Acute cystitis with hematuria: Secondary | ICD-10-CM | POA: Diagnosis not present

## 2019-08-17 DIAGNOSIS — R300 Dysuria: Secondary | ICD-10-CM | POA: Diagnosis present

## 2019-08-17 LAB — URINALYSIS, ROUTINE W REFLEX MICROSCOPIC
Bilirubin Urine: NEGATIVE
Glucose, UA: NEGATIVE mg/dL
Ketones, ur: NEGATIVE mg/dL
Nitrite: NEGATIVE
Protein, ur: NEGATIVE mg/dL
Specific Gravity, Urine: <1.005 — ABNORMAL LOW (ref 1.005–1.030)
pH: 6.5 (ref 5.0–8.0)

## 2019-08-17 LAB — URINALYSIS, MICROSCOPIC (REFLEX)

## 2019-08-17 MED ORDER — CEPHALEXIN 500 MG PO CAPS: 500.0000 mg | ORAL_CAPSULE | Freq: Two times a day (BID) | ORAL | 0 refills | Status: DC

## 2019-08-17 NOTE — ED Notes (Signed)
ED Provider at bedside. 

## 2019-08-17 NOTE — ED Triage Notes (Signed)
Pt reports dysuria, lower back pain and notices blood when she wipes x 2 days

## 2019-08-17 NOTE — ED Notes (Signed)
Pt is deaf but can read lips

## 2019-08-17 NOTE — ED Provider Notes (Signed)
[facility] EMERGENCY DEPARTMENT Provider Note   CSN: [id number] Arrival date & time: 08/17/19  1751     History Chief Complaint  Patient presents with  . Dysuria    Joan Horn is a 59 y.o. female.  59yo F w/ PMH below who p/w dysuria. Pt reports 2 days of dysuria, blood with wiping, and mild pain across her lower back bilaterally.  Symptoms feel like previous episodes of UTI.  She denies any fevers, vomiting, or significant abdominal pain.  Mild pelvic discomfort.  No vaginal bleeding or discharge.  The history is provided by the patient.  Dysuria      Past Medical History:  Diagnosis Date  . GERD (gastroesophageal reflux disease)   . Hearing impaired   . Kidney stone     Patient Active Problem List   Diagnosis Date Noted  . Sensorineural hearing loss, bilateral 06/28/2014  . Osteoporosis 02/21/2014  . Nephrolithiasis 11/02/2013  . Hyperlipidemia 03/07/2013  . Calculus of ureter 02/14/2013  . Essential hypertension 02/01/2013  . Gastro-esophageal reflux disease without esophagitis 08/01/2012  . Irritable bowel syndrome without diarrhea 08/01/2012  . Migraine, unspecified, not intractable, without status migrainosus 12/31/2011    Past Surgical History:  Procedure Laterality Date  . ABDOMINAL HYSTERECTOMY       OB History   No obstetric history on file.     No family history on file.  Social History   Tobacco Use  . Smoking status: Never Smoker  . Smokeless tobacco: Never Used  Substance Use Topics  . Alcohol use: No  . Drug use: No    Home Medications Prior to Admission medications   Medication Sig Start Date End Date Taking? Authorizing Provider  alendronate (FOSAMAX) 70 MG tablet Take 70 mg by mouth once a week. Take with a full glass of water on an empty stomach.    [provider]  amLODipine (NORVASC) 2.5 MG tablet TAKE 1 TABLET BY MOUTH ONCE DAILY IN THE MORNING 04/27/18   [provider]    amlodipine-atorvastatin (CADUET) 2.5-10 MG tablet Take 1 tablet by mouth daily.    [provider]  atenolol (TENORMIN) 50 MG tablet Take 50 mg by mouth daily.    [provider]  atorvastatin (LIPITOR) 10 MG tablet Take 10 mg by mouth daily.    [provider]  calcium-vitamin D (OSCAL WITH D) 500-200 MG-UNIT tablet Take 2 tablets by mouth.    [provider]  cephALEXin (KEFLEX) 500 MG capsule Take 1 capsule (500 mg total) by mouth 2 (two) times daily. 08/17/19   Katura Eatherly, Ambrose Finland, MD  famotidine (PEPCID) 20 MG tablet Take 20 mg by mouth 2 (two) times daily.    [provider]  omeprazole (PRILOSEC) 20 MG capsule Take 20 mg by mouth daily.    [provider]    Allergies    Aspirin, Lac bovis, Lactose, Shellfish allergy, Aspirin-acetaminophen-caffeine, and Sulfamethoxazole-trimethoprim  Review of Systems   Review of Systems  Genitourinary: Positive for dysuria.   All other systems reviewed and are negative except that which was mentioned in HPI  Physical Exam Updated Vital Signs BP 136/77   Pulse 78   Temp 98.2 F (36.8 C) (Oral)   Resp 18   Ht 4\' 10"  (1.473 m)   Wt 47.2 kg   SpO2 99%   BMI 21.74 kg/m   Physical Exam Vitals and nursing note reviewed.  Constitutional:      General: She is not in acute distress.  Appearance: She is well-developed.  HENT:     Head: Normocephalic and atraumatic.  Eyes:     Conjunctiva/sclera: Conjunctivae normal.  Cardiovascular:     Rate and Rhythm: Normal rate and regular rhythm.     Heart sounds: Normal heart sounds. No murmur heard.   Pulmonary:     Effort: Pulmonary effort is normal.     Breath sounds: Normal breath sounds.  Abdominal:     General: Bowel sounds are normal. There is no distension.     Palpations: Abdomen is soft.     Tenderness: There is no abdominal tenderness.  Musculoskeletal:     Cervical back: Neck supple.  Skin:    General: Skin is warm and dry.   Neurological:     Mental Status: She is alert and oriented to person, place, and time.     Comments: Fluent speech  Psychiatric:        Judgment: Judgment normal.     ED Results / Procedures / Treatments   Labs (all labs ordered are listed, but only abnormal results are displayed) Labs Reviewed  URINALYSIS, ROUTINE W REFLEX MICROSCOPIC - Abnormal; Notable for the following components:      Result Value   Specific Gravity, Urine <1.005 (*)    Hgb urine dipstick LARGE (*)    Leukocytes,Ua LARGE (*)    All other components within normal limits  URINALYSIS, MICROSCOPIC (REFLEX) - Abnormal; Notable for the following components:   Bacteria, UA FEW (*)    All other components within normal limits  URINE CULTURE    EKG None  Radiology No results found.  Procedures Procedures (including critical care time)  Medications Ordered in ED Medications - No data to display  ED Course  I have reviewed the triage vital signs and the nursing notes.  Pertinent labs that were available during my care of the patient were reviewed by me and considered in my medical decision making (see chart for details).    MDM Rules/Calculators/A&P                          Well-appearing on exam, soft abdomen, afebrile.  No flank or upper back pain to suggest pyelonephritis and no unilateral pain to suggest infected kidney stone.  UA consistent with infection, urine culture ordered.  Will treat with course of Keflex.  Reviewed return precautions and she voiced understanding. Final Clinical Impression(s) / ED Diagnoses Final diagnoses:  Acute cystitis with hematuria    Rx / DC Orders ED Discharge Orders         Ordered    cephALEXin (KEFLEX) 500 MG capsule  2 times daily     Discontinue  Reprint     08/17/19 1919           Tyresse Jayson, Ambrose Finland, MD 08/17/19 2334

## 2019-08-19 LAB — URINE CULTURE

## 2019-09-12 DIAGNOSIS — M5416 Radiculopathy, lumbar region: Secondary | ICD-10-CM | POA: Insufficient documentation

## 2020-07-05 DIAGNOSIS — M51369 Other intervertebral disc degeneration, lumbar region without mention of lumbar back pain or lower extremity pain: Secondary | ICD-10-CM | POA: Insufficient documentation

## 2020-07-05 DIAGNOSIS — M5136 Other intervertebral disc degeneration, lumbar region: Secondary | ICD-10-CM | POA: Insufficient documentation

## 2021-01-06 DIAGNOSIS — Z2821 Immunization not carried out because of patient refusal: Secondary | ICD-10-CM | POA: Insufficient documentation

## 2021-01-06 DIAGNOSIS — H9193 Unspecified hearing loss, bilateral: Secondary | ICD-10-CM | POA: Insufficient documentation

## 2021-01-06 DIAGNOSIS — Z0000 Encounter for general adult medical examination without abnormal findings: Secondary | ICD-10-CM | POA: Insufficient documentation

## 2021-03-14 ENCOUNTER — Emergency Department (HOSPITAL_BASED_OUTPATIENT_CLINIC_OR_DEPARTMENT_OTHER)
Admission: EM | Admit: 2021-03-14 | Discharge: 2021-03-14 | Disposition: A | Discharge status: Home / Self Care | Attending: Emergency Medicine | Admitting: Emergency Medicine

## 2021-03-14 ENCOUNTER — Encounter (HOSPITAL_BASED_OUTPATIENT_CLINIC_OR_DEPARTMENT_OTHER): Payer: Self-pay

## 2021-03-14 ENCOUNTER — Emergency Department (HOSPITAL_BASED_OUTPATIENT_CLINIC_OR_DEPARTMENT_OTHER)

## 2021-03-14 ENCOUNTER — Other Ambulatory Visit: Payer: Self-pay

## 2021-03-14 DIAGNOSIS — M545 Low back pain, unspecified: Secondary | ICD-10-CM | POA: Insufficient documentation

## 2021-03-14 DIAGNOSIS — R0789 Other chest pain: Secondary | ICD-10-CM | POA: Diagnosis not present

## 2021-03-14 DIAGNOSIS — Z79899 Other long term (current) drug therapy: Secondary | ICD-10-CM | POA: Insufficient documentation

## 2021-03-14 DIAGNOSIS — Z9621 Cochlear implant status: Secondary | ICD-10-CM | POA: Insufficient documentation

## 2021-03-14 DIAGNOSIS — R079 Chest pain, unspecified: Secondary | ICD-10-CM | POA: Diagnosis present

## 2021-03-14 DIAGNOSIS — R059 Cough, unspecified: Secondary | ICD-10-CM | POA: Diagnosis not present

## 2021-03-14 DIAGNOSIS — R051 Acute cough: Secondary | ICD-10-CM | POA: Insufficient documentation

## 2021-03-14 DIAGNOSIS — I10 Essential (primary) hypertension: Secondary | ICD-10-CM | POA: Insufficient documentation

## 2021-03-14 LAB — BASIC METABOLIC PANEL
Anion gap: 10 (ref 5–15)
BUN: 15 mg/dL (ref 8–23)
CO2: 26 mmol/L (ref 22–32)
Calcium: 9.7 mg/dL (ref 8.9–10.3)
Chloride: 105 mmol/L (ref 98–111)
Creatinine, Ser: 0.76 mg/dL (ref 0.44–1.00)
GFR, Estimated: >60 mL/min (ref >60)
Glucose, Bld: 107 mg/dL — ABNORMAL HIGH (ref 70–99)
Potassium: 4.3 mmol/L (ref 3.5–5.1)
Sodium: 141 mmol/L (ref 135–145)

## 2021-03-14 LAB — CBC
HCT: 47.8 % — ABNORMAL HIGH (ref 36.0–46.0)
Hemoglobin: 16 g/dL — ABNORMAL HIGH (ref 12.0–15.0)
MCH: 29.1 pg (ref 26.0–34.0)
MCHC: 33.5 g/dL (ref 30.0–36.0)
MCV: 87.1 fL (ref 80.0–100.0)
Platelets: 196 10*3/uL (ref 150–400)
RBC: 5.49 MIL/uL — ABNORMAL HIGH (ref 3.87–5.11)
RDW: 11.8 % (ref 11.5–15.5)
WBC: 5 10*3/uL (ref 4.0–10.5)
nRBC: 0 % (ref 0.0–0.2)

## 2021-03-14 LAB — TROPONIN I (HIGH SENSITIVITY)
Troponin I (High Sensitivity): 2 ng/L (ref <18)
Troponin I (High Sensitivity): 5 ng/L

## 2021-03-14 NOTE — ED Provider Notes (Signed)
[facility] EMERGENCY DEPARTMENT Provider Note   CSN: [id number] Arrival date & time: 03/14/21  1139     History  Chief Complaint  Patient presents with   Chest Pain    Joan Horn is a 61 y.o. female presenting for evaluation of chest pain.  Patient states that the past week she has had persistent chest pain, mostly in her left side going to her left neck and left shoulder.  Pain is constant, waxes and wanes in severity.  She denies associated shortness of breath, however states with normal activities such as her daily 2 mile walk she is feeling very tired and fatigued.  She states her husband is sick with bronchitis, she has had a mild cough but this has been improving.  Due to her symptoms, she saw her PCP today who did an EKG and was concerned for ST abnormalities and sent to the ER.  Patient denies fever, pleuritic pain, nausea, vomiting, diaphoresis, abdominal pain, urinary symptoms, normal bowel movements.  No history of similar pain.  She is never been evaluated by cardiology before.  She has a history of hypertension and hyperlipidemia.  No history of diabetes.  She is a never smoker  HPI     Home Medications Prior to Admission medications   Medication Sig Start Date End Date Taking? Authorizing Provider  alendronate (FOSAMAX) 70 MG tablet Take 70 mg by mouth once a week. Take with a full glass of water on an empty stomach.    [provider]  amLODipine (NORVASC) 2.5 MG tablet TAKE 1 TABLET BY MOUTH ONCE DAILY IN THE MORNING 04/27/18   [provider]  amlodipine-atorvastatin (CADUET) 2.5-10 MG tablet Take 1 tablet by mouth daily.    [provider]  atenolol (TENORMIN) 50 MG tablet Take 50 mg by mouth daily.    [provider]  atorvastatin (LIPITOR) 10 MG tablet Take 10 mg by mouth daily.    [provider]  calcium-vitamin D (OSCAL WITH D) 500-200 MG-UNIT tablet Take 2 tablets by mouth.    [provider]   cephALEXin (KEFLEX) 500 MG capsule Take 1 capsule (500 mg total) by mouth 2 (two) times daily. 08/17/19   Little, Ambrose Finland, MD  famotidine (PEPCID) 20 MG tablet Take 20 mg by mouth 2 (two) times daily.    [provider]  omeprazole (PRILOSEC) 20 MG capsule Take 20 mg by mouth daily.    [provider]      Allergies    Aspirin, Lac bovis, Lactose, Shellfish allergy, Aspirin-acetaminophen-caffeine, and Sulfamethoxazole-trimethoprim    Review of Systems   Review of Systems  Constitutional:  Positive for fatigue (with exertion).  Cardiovascular:  Positive for chest pain.  All other systems reviewed and are negative.  Physical Exam Updated Vital Signs BP (!) 154/86 (BP Location: Right Arm)    Pulse 87    Temp (!) 97.5 F (36.4 C) (Oral)    Resp 16    Ht 4\' 10"  (1.473 m)    Wt 44.1 kg    SpO2 100%    BMI 20.31 kg/m  Physical Exam Vitals and nursing note reviewed.  Constitutional:      General: She is not in acute distress.    Appearance: Normal appearance.     Comments: Resting in the bed in no acute distress  HENT:     Head: Normocephalic and atraumatic.  Eyes:     Conjunctiva/sclera: Conjunctivae normal.     Pupils: Pupils are equal,  round, and reactive to light.  Cardiovascular:     Rate and Rhythm: Normal rate and regular rhythm.     Pulses: Normal pulses.  Pulmonary:     Effort: Pulmonary effort is normal. No respiratory distress.     Breath sounds: Normal breath sounds. No wheezing.     Comments: Speaking in full sentences.  Clear lung sounds in all fields. Abdominal:     General: There is no distension.     Palpations: Abdomen is soft. There is no mass.     Tenderness: There is no abdominal tenderness. There is no guarding or rebound.  Musculoskeletal:        General: Normal range of motion.     Cervical back: Normal range of motion and neck supple.  Skin:    General: Skin is warm and dry.     Capillary Refill: Capillary refill takes less than 2  seconds.  Neurological:     Mental Status: She is alert and oriented to person, place, and time.  Psychiatric:        Mood and Affect: Mood and affect normal.        Speech: Speech normal.        Behavior: Behavior normal.    ED Results / Procedures / Treatments   Labs (all labs ordered are listed, but only abnormal results are displayed) Labs Reviewed  BASIC METABOLIC PANEL - Abnormal; Notable for the following components:      Result Value   Glucose, Bld 107 (*)    All other components within normal limits  CBC - Abnormal; Notable for the following components:   RBC 5.49 (*)    Hemoglobin 16.0 (*)    HCT 47.8 (*)    All other components within normal limits  TROPONIN I (HIGH SENSITIVITY)  TROPONIN I (HIGH SENSITIVITY)    EKG EKG Interpretation  Date/Time:  Friday March 14 2021 11:49:59 EST Ventricular Rate:  86 PR Interval:  142 QRS Duration: 82 QT Interval:  356 QTC Calculation: 426 R Axis:   66 Text Interpretation: Normal sinus rhythm Nonspecific ST abnormality Abnormal ECG No previous ECGs available No prior EKG for comparison Confirmed by Alona Bene [phone]) on 03/14/2021 11:56:44 AM  Radiology DG Chest 2 View  Result Date: 03/14/2021 CLINICAL DATA:  61 year old female with history of chest pain. EXAM: CHEST - 2 VIEW COMPARISON:  Chest x-ray 03/14/2021. FINDINGS: Lung volumes are normal. No consolidative airspace disease. No pleural effusions. No pneumothorax. No pulmonary nodule or mass noted. Pulmonary vasculature and the cardiomediastinal silhouette are within normal limits. IMPRESSION: No radiographic evidence of acute cardiopulmonary disease. Electronically Signed   By: Trudie Reed M.D.   On: 03/14/2021 12:29    Procedures Procedures    Medications Ordered in ED Medications - No data to display  ED Course/ Medical Decision Making/ A&P                           Medical Decision Making Amount and/or Complexity of Data Reviewed Labs:  ordered. Radiology: ordered.    This patient presents to the ED for concern of CP. This involves an extensive number of treatment options, and is a complaint that carries with it a high risk of complications and morbidity.  The differential diagnosis includes stable angina, ACS, viral illness, costochondritis, heart failure.   Co morbidities:  HTN, HLD   Lab Tests:  I ordered, and personally interpreted labs.  The pertinent results include: Negative  troponin x2.  Electrolytes stable.  EKG with nonspecific ST changes, no previous to compare.  Not consistent with STEMI.   Imaging Studies: X-ray taken from triage viewed and independently interpreted by me, no pneumonia, pneumothorax, effusion.   Cardiac Monitoring:  The patient was maintained on a cardiac monitor.  I personally viewed and interpreted the cardiac monitored which showed an underlying rhythm of: nsr   Test Considered:  no pleuritic sxs, doubt PE. Do not feel pt needs CTA of the chest   Consults:  I requested consultation with the cardiology service. Discussed with Dr. Wyline MoodBranch and discussed lab and imaging findings as well as pertinent plan - they recommend: Delta troponins.  If flat, plan for outpatient follow-up.  Disposition:  After consideration of the diagnostic results and the patients response to treatment, I feel that the patent would benefit from outpatient follow-up with cardiology.  Admission was considered, however after consultation with cardiology and 2 negative troponins, in the setting of fairly atypical chest pain, will have patient follow-up outpatient..  Discussed findings and plan with patient, who is agreeable.  Stressed importance of close follow-up with cardiology, referral placed and contact information for the office given.  At this time, patient appears safe for discharge.  Return precautions given.  Patient states she understands and agrees to plan   Final Clinical Impression(s) / ED  Diagnoses Final diagnoses:  None    Rx / DC Orders ED Discharge Orders     None         Alveria ApleyCaccavale, Keshav Winegar, PA-C 03/14/21 1720    Maia PlanLong, Joshua G, MD 03/20/21 1620

## 2021-03-14 NOTE — Discharge Instructions (Signed)
Your work-up today did not show any sign of active heart damage.  However with your symptoms, it is extremely important that you follow-up with a cardiologist.  I placed a referral to cardiology, or you can call the office listed below to set up a follow-up appointment.  If you having difficulty getting an appointment, contact your primary care doctor to assist with referral. Return to the emergency room if develop increased chest pain, increased difficulty breathing, any new, worsening, or concerning symptoms

## 2021-03-14 NOTE — ED Triage Notes (Signed)
Triage delayed r/t patient going to bathroom after checking in.   Pt arrives ambulatory to ED with reports of CP into back starting about a week ago reports she initially thought it was cold or flu and treated with OTC medications. Denies any fever reports some cough. Seen at [facility] today and sent here with her EKG.

## 2021-03-14 NOTE — ED Notes (Signed)
ED Provider at bedside. 

## 2021-03-24 DIAGNOSIS — M542 Cervicalgia: Secondary | ICD-10-CM | POA: Insufficient documentation

## 2021-03-24 DIAGNOSIS — M25512 Pain in left shoulder: Secondary | ICD-10-CM | POA: Insufficient documentation

## 2021-03-24 DIAGNOSIS — M25511 Pain in right shoulder: Secondary | ICD-10-CM | POA: Insufficient documentation

## 2021-04-21 DIAGNOSIS — H6090 Unspecified otitis externa, unspecified ear: Secondary | ICD-10-CM | POA: Insufficient documentation

## 2021-04-21 DIAGNOSIS — T300 Burn of unspecified body region, unspecified degree: Secondary | ICD-10-CM | POA: Insufficient documentation

## 2021-04-21 DIAGNOSIS — Z79890 Hormone replacement therapy (postmenopausal): Secondary | ICD-10-CM | POA: Insufficient documentation

## 2021-04-21 DIAGNOSIS — H903 Sensorineural hearing loss, bilateral: Secondary | ICD-10-CM | POA: Insufficient documentation

## 2021-04-21 DIAGNOSIS — H93293 Other abnormal auditory perceptions, bilateral: Secondary | ICD-10-CM | POA: Insufficient documentation

## 2021-04-22 ENCOUNTER — Other Ambulatory Visit: Payer: Self-pay

## 2021-04-22 ENCOUNTER — Encounter: Payer: Self-pay | Admitting: Cardiology

## 2021-04-22 ENCOUNTER — Ambulatory Visit (INDEPENDENT_AMBULATORY_CARE_PROVIDER_SITE_OTHER): Admitting: Cardiology

## 2021-04-22 VITALS — BP 114/72 | HR 67 | Ht <= 58 in | Wt 96.0 lb

## 2021-04-22 DIAGNOSIS — H903 Sensorineural hearing loss, bilateral: Secondary | ICD-10-CM

## 2021-04-22 DIAGNOSIS — E782 Mixed hyperlipidemia: Secondary | ICD-10-CM

## 2021-04-22 DIAGNOSIS — I209 Angina pectoris, unspecified: Secondary | ICD-10-CM | POA: Diagnosis not present

## 2021-04-22 DIAGNOSIS — I10 Essential (primary) hypertension: Secondary | ICD-10-CM | POA: Diagnosis not present

## 2021-04-22 DIAGNOSIS — R079 Chest pain, unspecified: Secondary | ICD-10-CM

## 2021-04-22 MED ORDER — METOPROLOL SUCCINATE ER 50 MG PO TB24: 50.0000 mg | ORAL_TABLET | Freq: Once | ORAL | 0 refills | Status: DC

## 2021-04-22 MED ORDER — CLOPIDOGREL BISULFATE 75 MG PO TABS: 75.0000 mg | ORAL_TABLET | Freq: Every day | ORAL | 3 refills | Status: DC

## 2021-04-22 MED ORDER — ATORVASTATIN CALCIUM 20 MG PO TABS: 20.0000 mg | ORAL_TABLET | Freq: Every day | ORAL | 3 refills | Status: DC

## 2021-04-22 MED ORDER — NITROGLYCERIN 0.4 MG SL SUBL: 0.4000 mg | SUBLINGUAL_TABLET | SUBLINGUAL | 2 refills | Status: DC | PRN

## 2021-04-22 MED ORDER — METOPROLOL TARTRATE 25 MG PO TABS: 25.0000 mg | ORAL_TABLET | Freq: Two times a day (BID) | ORAL | 3 refills | Status: DC

## 2021-04-22 NOTE — Progress Notes (Signed)
? ?Cardiology Consultation:   ? ?Date:  04/22/2021  ? ?IDSonoma Horn, DOB 09/30/1960, MRN [id number] ? ?PCP:  Joan Dales, PA  ?Cardiologist:  Joan Balsam, MD  ? ?Referring MD: Joan Apley, PA-C  ? ?No chief complaint on file. ?No shortness of breath or chest pain ? ?History of Present Illness:   ? ?Joan Horn is a 61 y.o. female who is being seen today for the evaluation of shortness of breath chest pain at the request of Horn, Sophia, PA-C.  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  She was referred to [place] because of episode of chest pain.  She loves to walk on the regular basis she goes on very hilly area when she walks upstairs for the last few months she experienced shortness of breath with some heaviness in the chest she reports to the sensation she described sensation 3 in scale up to 10.  It is relieved with slowing down or going downhill.  It does not get worse overall.  Recently she ended up going to the emergency room, troponins were done and were negative, EKG showing no acute changes. ?She does have multiple risk factors for coronary artery disease namely family history but not premature coronary artery disease, she never smoked.  She does have hypertension dyslipidemia.  She is on statin already. ?She is not on any diet. ? ?Past Medical History:  ?Diagnosis Date  ? GERD (gastroesophageal reflux disease)   ? Hearing impaired   ? Kidney stone   ? ? ?Past Surgical History:  ?Procedure Laterality Date  ? ABDOMINAL HYSTERECTOMY    ? ? ?Current Medications: ?Current Meds  ?Medication Sig  ? amLODipine (NORVASC) 5 MG tablet Take 5 mg by mouth daily.  ? atorvastatin (LIPITOR) 10 MG tablet Take 10 mg by mouth daily.  ? calcium-vitamin D (OSCAL WITH D) 500-200 MG-UNIT tablet Take 2 tablets by mouth.  ? celecoxib (CELEBREX) 100 MG capsule Take 100 mg by mouth 2 (two) times daily.  ?  famotidine (PEPCID) 20 MG tablet Take 20 mg by mouth 2 (two) times daily.  ? gabapentin (NEURONTIN) 100 MG capsule Take 100 mg by mouth 2 (two) times daily.  ?  ? ?Allergies:   Aspirin, Lac bovis, Lactose, Other, Shellfish allergy, Aspirin-acetaminophen-caffeine, and Sulfamethoxazole-trimethoprim  ? ?Social History  ? ?Socioeconomic History  ? Marital status: Married  ?  Spouse name: Not on file  ? Number of children: Not on file  ? Years of education: Not on file  ? Highest education level: Not on file  ?Occupational History  ? Not on file  ?Tobacco Use  ? Smoking status: Never  ? Smokeless tobacco: Never  ?Vaping Use  ? Vaping Use: Never used  ?Substance and Sexual Activity  ? Alcohol use: No  ? Drug use: No  ? Sexual activity: Yes  ?  Birth control/protection: Surgical  ?Other Topics Concern  ? Not on file  ?Social History Narrative  ? Not on file  ? ?Social Determinants of Health  ? ?Financial Resource Strain: Not on file  ?Food Insecurity: Not on file  ?Transportation Needs: Not on file  ?Physical Activity: Not on file  ?Stress: Not on file  ?Social Connections: Not on file  ?  ? ?Family History: ?The patient's family history includes Cancer in her mother; Coronary artery disease in her father. ?ROS:   ?Please see the history of present illness.    ?  All 14 point review of systems negative except as described per history of present illness. ? ?EKGs/Labs/Other Studies Reviewed:   ? ?The following studies were reviewed today: ? ? ?EKG:  EKG is  ordered today.  The ekg ordered today demonstrates normal sinus rhythm, normal P interval, normal QS complex duration fulgent, no ST segment changes. ? ?Recent Labs: ?03/14/2021: BUN 15; Creatinine, Ser 0.76; Hemoglobin 16.0; Platelets 196; Potassium 4.3; Sodium 141  ?Recent Lipid Panel ?No results found for: CHOL, TRIG, HDL, CHOLHDL, VLDL, LDLCALC, LDLDIRECT ? ?Physical Exam:   ? ?VS:  BP 114/72   Pulse 67   Ht 4\' 10"  (1.473 m)   Wt 96 lb (43.5 kg)   SpO2 98%   BMI  20.06 kg/m?    ? ?Wt Readings from Last 3 Encounters:  ?04/22/21 96 lb (43.5 kg)  ?03/14/21 97 lb 3.2 oz (44.1 kg)  ?08/17/19 104 lb (47.2 kg)  ?  ? ?GEN:  Well nourished, well developed in no acute distress ?HEENT: Normal ?NECK: No JVD; No carotid bruits ?LYMPHATICS: No lymphadenopathy ?CARDIAC: RRR, no murmurs, no rubs, no gallops ?RESPIRATORY:  Clear to auscultation without rales, wheezing or rhonchi  ?ABDOMEN: Soft, non-tender, non-distended ?MUSCULOSKELETAL:  No edema; No deformity  ?SKIN: Warm and dry ?NEUROLOGIC:  Alert and oriented x 3 ?PSYCHIATRIC:  Normal affect  ? ?ASSESSMENT:   ? ?1. Essential hypertension   ?2. Angina pectoris (HCC)   ?3. Sensorineural hearing loss, bilateral   ?4. Mixed hyperlipidemia   ? ?PLAN:   ? ?In order of problems listed above: ? ?Angina pectoris.  She describes fairly typical symptoms we did discuss options for this complex clinical scenario including stress testing cardiac catheterization coronary CT angio.  Look like advised test for her would be coronary CT angio that showed what she wants to do.  I will start her on antiplatelet therapy.  Apparently she is allergic to aspirin therefore I will start her with clopidogrel 75 mg daily, I will give her prescription for metoprolol 25 twice daily, I also gave her a prescription for nitroglycerin.  As a part of evaluation echocardiogram will be performed to look at left ventricle ejection fraction especially in view of the fact that she is complaining of pain shortness of breath while walking uphill.  I told her that until we figure out what is going on or not to push herself. ?Essential hypertension: Blood pressure well controlled continue present management. ?Dyslipidemia she is already on moderate intensity statin, I reviewed her cholesterol profile from November of last year showing LDL of 92 HDL 89.  I will double the dose of Lipitor to 20 mg daily. ? ? ?Medication Adjustments/Labs and Tests Ordered: ?Current medicines are  reviewed at length with the patient today.  Concerns regarding medicines are outlined above.  ?No orders of the defined types were placed in this encounter. ? ?No orders of the defined types were placed in this encounter. ? ? ?Signed, ?December, MD, [facility]. ?04/22/2021 10:07 AM    ?[facility] ?

## 2021-04-22 NOTE — Patient Instructions (Signed)
Medication Instructions:  ?Your physician has recommended you make the following change in your medication:  ? ?START: Nitro 0.4mg  under the tongue every 5 minutes as needed for chest pain ?START: Metoprolol tartrate 25 mg twice daily ?START: Lipitor 20 mg daily ?START: Plavix 75 mg daily ? ?*If you need a refill on your cardiac medications before your next appointment, please call your pharmacy* ? ? ?Lab Work: ? ?BMP 1 week before CT ? ?If you have labs (blood work) drawn today and your tests are completely normal, you will receive your results only by: ?MyChart Message (if you have MyChart) OR ?A paper copy in the mail ?If you have any lab test that is abnormal or we need to change your treatment, we will call you to review the results. ? ? ?Testing/Procedures: ?Your physician has requested that you have an echocardiogram. Echocardiography is a painless test that uses sound waves to create images of your heart. It provides your doctor with information about the size and shape of your heart and how well your heart?s chambers and valves are working. This procedure takes approximately one hour. There are no restrictions for this procedure. ? ? ? ?Your cardiac CT will be scheduled at one of the below locations:  ? ?[facility] ?[address] ?[place], [place] [address] ?[phone] ? ?OR ? ?[facility] ?[address] ?[address] ?[place], [place] [address] ?[phone] ? ?If scheduled at [facility], please arrive at the [facility] and Children's Entrance (Entrance C2) of [facility] 30 minutes prior to test start time. ?You can use the FREE valet parking offered at entrance C (encouraged to control the heart rate for the test)  ?Proceed to the [facility] Radiology Department (first floor) to check-in and test prep. ? ?All radiology patients and guests should use entrance C2 at [facility], accessed from [facility], even though the  hospital's physical address listed is [address]. ? ? ? ?If scheduled at [facility], please arrive 15 mins early for check-in and test prep. ? ?Please follow these instructions carefully (unless otherwise directed): ? ? ?On the Night Before the Test: ?Be sure to Drink plenty of water. ?Do not consume any caffeinated/decaffeinated beverages or chocolate 12 hours prior to your test. ?Do not take any antihistamines 12 hours prior to your test. ? ?On the Day of the Test: ?Drink plenty of water until 1 hour prior to the test. ?Do not eat any food 4 hours prior to the test. ?You may take your regular medications prior to the test.  ?Take metoprolol (Lopressor) two hours prior to test. ?FEMALES- please wear underwire-free bra if available, avoid dresses & tight clothing ?   ?After the Test: ?Drink plenty of water. ?After receiving IV contrast, you may experience a mild flushed feeling. This is normal. ?On occasion, you may experience a mild rash up to 24 hours after the test. This is not dangerous. If this occurs, you can take Benadryl 25 mg and increase your fluid intake. ?If you experience trouble breathing, this can be serious. If it is severe call 911 IMMEDIATELY. If it is mild, please call our office. ? ?We will call to schedule your test 2-4 weeks out understanding that some insurance companies will need an authorization prior to the service being performed.  ? ?For non-scheduling related questions, please contact the cardiac imaging nurse navigator should you have any questions/concerns: ?Rockwell Alexandria, Cardiac Imaging Nurse  Navigator ?Larey Brick, Cardiac Imaging Nurse Navigator ?[facility] ?Direct Office Dial: [phone]  ? ?For scheduling needs, including cancellations and rescheduling, please call [place], [phone]. ? ? ? ?Follow-Up: ?At [facility], you and your health needs are our priority.  As part of our continuing mission to  provide you with exceptional heart care, we have created designated Provider Care Teams.  These Care Teams include your primary Cardiologist (physician) and Advanced Practice Providers (APPs -  Physician Assistants and Nurse Practitioners) who all work together to provide you with the care you need, when you need it. ? ?We recommend signing up for the patient portal called "MyChart".  Sign up information is provided on this After Visit Summary.  MyChart is used to connect with patients for Virtual Visits (Telemedicine).  Patients are able to view lab/test results, encounter notes, upcoming appointments, etc.  Non-urgent messages can be sent to your provider as well.   ?To learn more about what you can do with MyChart, go to [email or web address].   ? ?Your next appointment:   ?2 month(s) ? ?The format for your next appointment:   ?In Person ? ?Provider:   ?Gypsy Balsam, MD  ? ? ?Other Instructions ?None ? ?

## 2021-04-25 ENCOUNTER — Other Ambulatory Visit: Payer: Self-pay

## 2021-04-25 ENCOUNTER — Ambulatory Visit (HOSPITAL_BASED_OUTPATIENT_CLINIC_OR_DEPARTMENT_OTHER)
Admission: RE | Admit: 2021-04-25 | Discharge: 2021-04-25 | Disposition: A | Discharge status: Home / Self Care | Source: Ambulatory Visit | Attending: Cardiology | Admitting: Cardiology

## 2021-04-25 DIAGNOSIS — I10 Essential (primary) hypertension: Secondary | ICD-10-CM | POA: Diagnosis present

## 2021-04-25 DIAGNOSIS — I209 Angina pectoris, unspecified: Secondary | ICD-10-CM | POA: Diagnosis present

## 2021-04-25 DIAGNOSIS — R0609 Other forms of dyspnea: Secondary | ICD-10-CM

## 2021-04-25 DIAGNOSIS — R079 Chest pain, unspecified: Secondary | ICD-10-CM | POA: Insufficient documentation

## 2021-04-25 DIAGNOSIS — E782 Mixed hyperlipidemia: Secondary | ICD-10-CM | POA: Diagnosis present

## 2021-04-25 DIAGNOSIS — H903 Sensorineural hearing loss, bilateral: Secondary | ICD-10-CM | POA: Insufficient documentation

## 2021-04-25 LAB — ECHOCARDIOGRAM COMPLETE
AR max vel: 2.4 cm2
AV Area VTI: 2.29 cm2
AV Area mean vel: 2.2 cm2
AV Mean grad: 3 mmHg
AV Peak grad: 4.7 mmHg
Ao pk vel: 1.08 m/s
Area-P 1/2: 3.28 cm2
S' Lateral: 2.9 cm

## 2021-04-25 NOTE — Progress Notes (Signed)
?  Echocardiogram ?2D Echocardiogram has been performed. ? ?Joan Horn ?04/25/2021, 10:29 AM ?

## 2021-04-29 ENCOUNTER — Telehealth: Payer: Self-pay | Admitting: Cardiology

## 2021-04-29 NOTE — Telephone Encounter (Signed)
Pt returning a phone call for results.. please advise  ?

## 2021-04-29 NOTE — Telephone Encounter (Signed)
Patient informed of results.  

## 2021-04-30 LAB — BASIC METABOLIC PANEL
BUN/Creatinine Ratio: 19 (ref 12–28)
BUN: 15 mg/dL (ref 8–27)
CO2: 27 mmol/L (ref 20–29)
Calcium: 9.7 mg/dL (ref 8.7–10.3)
Chloride: 104 mmol/L (ref 96–106)
Creatinine, Ser: 0.79 mg/dL (ref 0.57–1.00)
Glucose: 91 mg/dL (ref 70–99)
Potassium: 4.6 mmol/L (ref 3.5–5.2)
Sodium: 143 mmol/L (ref 134–144)
eGFR: 85 mL/min/{1.73_m2} (ref >59)

## 2021-05-01 ENCOUNTER — Encounter: Payer: Self-pay | Admitting: Cardiology

## 2021-05-05 ENCOUNTER — Telehealth (HOSPITAL_COMMUNITY): Payer: Self-pay | Admitting: Emergency Medicine

## 2021-05-05 ENCOUNTER — Telehealth (HOSPITAL_COMMUNITY): Payer: Self-pay | Admitting: *Deleted

## 2021-05-05 NOTE — Telephone Encounter (Signed)
Attempted to call patient regarding upcoming cardiac CT appointment. °Left message on voicemail with name and callback number ° °Jowana Thumma RN Navigator Cardiac Imaging ° [facility] °[phone] Office °[phone] Cell ° °

## 2021-05-05 NOTE — Telephone Encounter (Signed)
Reaching out to patient to offer assistance regarding upcoming cardiac imaging study; pt verbalizes understanding of appt date/time, parking situation and where to check in, pre-test NPO status and medications ordered, and verified current allergies; name and call back number provided for further questions should they arise ?Rockwell Alexandria RN Navigator Cardiac Imaging ?[facility] ?[phone] office ?[phone] cell ? ?Denies iv issues ?Metoprolol tartrate ?Arrival 900 ?

## 2021-05-06 ENCOUNTER — Ambulatory Visit (HOSPITAL_COMMUNITY)
Admission: RE | Admit: 2021-05-06 | Discharge: 2021-05-06 | Disposition: A | Discharge status: Home / Self Care | Source: Ambulatory Visit | Attending: Cardiology | Admitting: Cardiology

## 2021-05-06 ENCOUNTER — Other Ambulatory Visit: Payer: Self-pay

## 2021-05-06 DIAGNOSIS — R079 Chest pain, unspecified: Secondary | ICD-10-CM | POA: Insufficient documentation

## 2021-05-06 MED ORDER — IOHEXOL 350 MG/ML SOLN
95.0000 mL | Freq: Once | INTRAVENOUS | Status: AC | PRN
  Administered 2021-05-06: 95 mL via INTRAVENOUS

## 2021-05-06 MED ORDER — NITROGLYCERIN 0.4 MG SL SUBL
SUBLINGUAL_TABLET | SUBLINGUAL | Status: AC
  Filled 2021-05-06: qty 2

## 2021-05-06 MED ORDER — NITROGLYCERIN 0.4 MG SL SUBL
0.8000 mg | SUBLINGUAL_TABLET | Freq: Once | SUBLINGUAL | Status: AC
  Administered 2021-05-06: 0.8 mg via SUBLINGUAL

## 2021-05-07 ENCOUNTER — Telehealth: Payer: Self-pay

## 2021-05-07 NOTE — Telephone Encounter (Signed)
-----   Message from Georgeanna Lea, MD sent at 04/30/2021  9:43 AM EDT ----- ?Chem-7 looks good, proceed with coronary CT angio ?

## 2021-05-07 NOTE — Telephone Encounter (Signed)
Unable to reach the patient in multiple occasions. I mailed a ltter as a last attempt to reach the patient ?

## 2021-05-08 ENCOUNTER — Encounter: Payer: Self-pay | Admitting: Cardiology

## 2021-05-08 NOTE — Telephone Encounter (Signed)
Return patient call again and left a message.  ?

## 2021-05-08 NOTE — Telephone Encounter (Signed)
Patient was returning call. Please advise ?

## 2021-05-08 NOTE — Telephone Encounter (Signed)
Joan Lea, MD  You 33 minutes ago (3:40 PM)  ? ?She need to take both amlodipine as well as metoprolol   ? ? ?Called pt back and spoke to husband Joan Horn (on [place]) with patient present in background. Pt verbalized understanding and will resume taking her Amlodipine along with the newly prescribed Metoprolol. ?

## 2021-05-08 NOTE — Telephone Encounter (Signed)
Per Dr Wendy Poet OV note on 04/22/21: ? ? 1. I will give her prescription for metoprolol 25 twice daily, I also gave her a prescription for nitroglycerin.  As a part of evaluation echocardiogram will be performed to look at left ventricle ejection fraction especially in view of the fact that she is complaining of pain shortness of breath while walking uphill.  I told her that until we figure out what is going on or not to push herself. ?2. Essential hypertension: Blood pressure well controlled continue present management. ? ?Pt states that she stopped taking her Amlodipine the same day as this visit and was under the impression that the metoprolol was taking the place of that drug. Pt called in today to verify if she should be taking the Amlodipine or not. Pt reports that she does not check her BP at home regularly and said last week it was 110/66. Asked her to check today via MyChart and she stated current BP is 152/84. Pt is asymptomatic, just wanted to verify medication regimen. Will route to MD for review as she was on Amlodipine up to day of visit and had taken it same day as OV. Pt advised to start checking her BP once daily 1-2 hours after taking her medication. Will remain off Amlodipine until she hears from our office. ?

## 2021-05-15 ENCOUNTER — Telehealth: Payer: Self-pay

## 2021-05-15 NOTE — Telephone Encounter (Signed)
Patient notified of results.

## 2021-05-15 NOTE — Telephone Encounter (Signed)
-----   Message from Park Liter, MD sent at 05/07/2021  7:51 PM EDT ----- ?Minimal, nonobstructive coronary artery disease on the CT. ?

## 2021-05-16 ENCOUNTER — Other Ambulatory Visit: Payer: Self-pay

## 2021-07-07 DIAGNOSIS — R109 Unspecified abdominal pain: Secondary | ICD-10-CM | POA: Insufficient documentation

## 2021-07-09 ENCOUNTER — Encounter: Payer: Self-pay | Admitting: Cardiology

## 2021-07-09 ENCOUNTER — Ambulatory Visit (INDEPENDENT_AMBULATORY_CARE_PROVIDER_SITE_OTHER): Admitting: Cardiology

## 2021-07-09 VITALS — BP 124/78 | HR 62 | Ht <= 58 in | Wt 93.0 lb

## 2021-07-09 DIAGNOSIS — I209 Angina pectoris, unspecified: Secondary | ICD-10-CM | POA: Diagnosis not present

## 2021-07-09 DIAGNOSIS — E782 Mixed hyperlipidemia: Secondary | ICD-10-CM

## 2021-07-09 DIAGNOSIS — I10 Essential (primary) hypertension: Secondary | ICD-10-CM | POA: Diagnosis not present

## 2021-07-09 DIAGNOSIS — K219 Gastro-esophageal reflux disease without esophagitis: Secondary | ICD-10-CM

## 2021-07-09 MED ORDER — METOPROLOL SUCCINATE ER 25 MG PO TB24: 25.0000 mg | ORAL_TABLET | Freq: Every day | ORAL | 3 refills | Status: DC

## 2021-07-09 NOTE — Patient Instructions (Signed)
Medication Instructions:  Your physician has recommended you make the following change in your medication: START: Metoprolol Succinate 25mg  1 tablet by mouth daily    Lab Work: None Ordered If you have labs (blood work) drawn today and your tests are completely normal, you will receive your results only by: [place] (if you have MyChart) OR A paper copy in the mail If you have any lab test that is abnormal or we need to change your treatment, we will call you to review the results.   Testing/Procedures: None Ordered   Follow-Up: At [facility], you and your health needs are our priority.  As part of our continuing mission to provide you with exceptional heart care, we have created designated Provider Care Teams.  These Care Teams include your primary Cardiologist (physician) and Advanced Practice Providers (APPs -  Physician Assistants and Nurse Practitioners) who all work together to provide you with the care you need, when you need it.  We recommend signing up for the patient portal called "MyChart".  Sign up information is provided on this After Visit Summary.  MyChart is used to connect with patients for Virtual Visits (Telemedicine).  Patients are able to view lab/test results, encounter notes, upcoming appointments, etc.  Non-urgent messages can be sent to your provider as well.   To learn more about what you can do with MyChart, go to [email or web address].    Your next appointment:   5 month(s)  The format for your next appointment:   In Person  Provider:   Jenne Campus, MD    Other Instructions NA

## 2021-07-09 NOTE — Progress Notes (Signed)
Cardiology Office Note:    Date:  07/09/2021   ID:  Joan Horn, DOB Feb 05, 1961, MRN [id number]  PCP:  Fortino Sic, PA  Cardiologist:  Jenne Campus, MD    Referring MD: Fortino Sic, *   Chief Complaint  Patient presents with   Follow-up  Doing much better  History of Present Illness:    Joan Horn is a 61 y.o. 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  She comes today to talk about it.  Overall she is doing much better she says she can walk climb stairs she has no difficulty if she walks between 2 and 4 miles every single day with her husband and have no difficulty doing the symptoms are almost completely subsided she said very rarely she will get this sensation.  She does have history of GI issues which could be contributing to her symptomatology  Past Medical History:  Diagnosis Date   GERD (gastroesophageal reflux disease)    Hearing impaired    Kidney stone     Past Surgical History:  Procedure Laterality Date   ABDOMINAL HYSTERECTOMY      Current Medications: Current Meds  Medication Sig   amLODipine (NORVASC) 5 MG tablet Take 5 mg by mouth daily.   atorvastatin (LIPITOR) 20 MG tablet Take 1 tablet (20 mg total) by mouth daily.   calcium-vitamin D (OSCAL WITH D) 500-200 MG-UNIT tablet Take 2 tablets by mouth daily with breakfast.   celecoxib (CELEBREX) 100 MG capsule Take 100 mg by mouth 2 (two) times daily.   famotidine (PEPCID) 20 MG tablet Take 20 mg by mouth 2 (two) times daily.   gabapentin (NEURONTIN) 100 MG capsule Take 100 mg by mouth 2 (two) times daily.     Allergies:   Aspirin, Lac bovis, Lactose, Other,  Shellfish allergy, Aspirin-acetaminophen-caffeine, and Sulfamethoxazole-trimethoprim   Social History   Socioeconomic History   Marital status: Married    Spouse name: Not on file   Number of children: Not on file   Years of education: Not on file   Highest education level: Not on file  Occupational History   Not on file  Tobacco Use   Smoking status: Never   Smokeless tobacco: Never  Vaping Use   Vaping Use: Never used  Substance and Sexual Activity   Alcohol use: No   Drug use: No   Sexual activity: Yes    Birth control/protection: Surgical  Other Topics Concern   Not on file  Social History Narrative   Not on file   Social Determinants of Health   Financial Resource Strain: Not on file  Food Insecurity: Not on file  Transportation Needs: Not on file  Physical Activity: Not on file  Stress: Not on file  Social Connections: Not on file     Family History: The patient's family history includes Cancer in her mother; Coronary artery disease in her father. ROS:   Please see the history of present illness.    All 14 point review of systems negative except as described per history of present illness  EKGs/Labs/Other Studies Reviewed:      Recent Labs: 03/14/2021: Hemoglobin 16.0; Platelets 196 04/29/2021: BUN 15; Creatinine, Ser 0.79;  Potassium 4.6; Sodium 143  Recent Lipid Panel No results found for: CHOL, TRIG, HDL, CHOLHDL, VLDL, LDLCALC, LDLDIRECT  Physical Exam:    VS:  BP 124/78 (BP Location: Left Arm, Patient Position: Sitting)   Pulse 62   Ht 4\' 10"  (1.473 m)   Wt 93 lb (42.2 kg)   SpO2 99%   BMI 19.44 kg/m     Wt Readings from Last 3 Encounters:  07/09/21 93 lb (42.2 kg)  04/22/21 96 lb (43.5 kg)  03/14/21 97 lb 3.2 oz (44.1 kg)     GEN:  Well nourished, well developed in no acute distress HEENT: Normal NECK: No JVD; No carotid bruits LYMPHATICS: No lymphadenopathy CARDIAC: RRR, no murmurs, no rubs, no gallops RESPIRATORY:  Clear to  auscultation without rales, wheezing or rhonchi  ABDOMEN: Soft, non-tender, non-distended MUSCULOSKELETAL:  No edema; No deformity  SKIN: Warm and dry LOWER EXTREMITIES: no swelling NEUROLOGIC:  Alert and oriented x 3 PSYCHIATRIC:  Normal affect   ASSESSMENT:    1. Angina pectoris ([place])   2. Essential hypertension   3. Gastro-esophageal reflux disease without esophagitis   4. Mixed hyperlipidemia    PLAN:    In order of problems listed above:  Angina pectoris look fairly typical about her coronary CT angiogram revealed only minimal disease in the proximal portion of LAD with 0 to 25%, there was also 0 to 25% stenosis of the obtuse marginal branch.  Everything else looked normal.  I still think that she may have some small vessel disease and I think there are some role for beta-blocker give her small dose of metoprolol succinate 25 mg daily.  In the meantime I encouraged her to take antiplatelets therapy in form of Plavix, also will make sure her cholesterol is well managed Essential hypertension blood pressure well controlled today we will continue present management. Dyslipidemia: She is on Lipitor 20 which I will continue her last LDL 60 HDL 82 reviewed from K PN show excellent cholesterol we will continue present management. We did talk about healthy lifestyle need to exercise and good diet  Medication Adjustments/Labs and Tests Ordered: Current medicines are reviewed at length with the patient today.  Concerns regarding medicines are outlined above.  No orders of the defined types were placed in this encounter.  Medication changes: No orders of the defined types were placed in this encounter.   Signed, Park Liter, MD, [facility] 07/09/2021 8:17 AM    [place]

## 2022-04-12 ENCOUNTER — Other Ambulatory Visit: Payer: Self-pay | Admitting: Cardiology

## 2022-04-13 NOTE — Telephone Encounter (Signed)
Refill to pharmacy, needs appointment for future refills

## 2022-05-24 ENCOUNTER — Other Ambulatory Visit: Payer: Self-pay | Admitting: Cardiology

## 2022-08-21 ENCOUNTER — Other Ambulatory Visit: Payer: Self-pay | Admitting: Cardiology

## 2022-11-14 ENCOUNTER — Emergency Department (HOSPITAL_BASED_OUTPATIENT_CLINIC_OR_DEPARTMENT_OTHER)

## 2022-11-14 ENCOUNTER — Emergency Department (HOSPITAL_BASED_OUTPATIENT_CLINIC_OR_DEPARTMENT_OTHER)
Admission: EM | Admit: 2022-11-14 | Discharge: 2022-11-14 | Disposition: A | Discharge status: Home / Self Care | Attending: Emergency Medicine | Admitting: Emergency Medicine

## 2022-11-14 ENCOUNTER — Encounter (HOSPITAL_BASED_OUTPATIENT_CLINIC_OR_DEPARTMENT_OTHER): Payer: Self-pay

## 2022-11-14 ENCOUNTER — Other Ambulatory Visit: Payer: Self-pay

## 2022-11-14 DIAGNOSIS — I10 Essential (primary) hypertension: Secondary | ICD-10-CM | POA: Insufficient documentation

## 2022-11-14 DIAGNOSIS — W010XXA Fall on same level from slipping, tripping and stumbling without subsequent striking against object, initial encounter: Secondary | ICD-10-CM | POA: Insufficient documentation

## 2022-11-14 DIAGNOSIS — S52502A Unspecified fracture of the lower end of left radius, initial encounter for closed fracture: Secondary | ICD-10-CM | POA: Diagnosis not present

## 2022-11-14 DIAGNOSIS — M25532 Pain in left wrist: Secondary | ICD-10-CM | POA: Diagnosis present

## 2022-11-14 MED ORDER — SENNOSIDES-DOCUSATE SODIUM 8.6-50 MG PO TABS: 1.0000 | ORAL_TABLET | Freq: Every evening | ORAL | 0 refills | Status: DC | PRN

## 2022-11-14 MED ORDER — OXYCODONE-ACETAMINOPHEN 5-325 MG PO TABS
1.0000 | ORAL_TABLET | Freq: Four times a day (QID) | ORAL | 0 refills | Status: DC | PRN
Start: 2022-11-14 — End: 2022-11-17

## 2022-11-14 MED ORDER — OXYCODONE-ACETAMINOPHEN 5-325 MG PO TABS
1.0000 | ORAL_TABLET | Freq: Once | ORAL | Status: AC
  Administered 2022-11-14: 1 via ORAL
  Filled 2022-11-14: qty 1

## 2022-11-14 NOTE — ED Provider Notes (Signed)
Emergency Department Provider Note   I have reviewed the triage vital signs and the nursing notes.   HISTORY  Chief Complaint Fall and Wrist Pain   HPI Joan Horn is a 61 y.o. female with PMH reviewed presents to the emergency department for evaluation of left wrist pain after fall.  She was dog sitting and tripped over the dog falling on her outstretched hand.  She has pain in the left wrist and forearm.  Some discomfort into the elbow.  Pain with moving her fingers but the location of pain remains in her forearm with movement.  No shoulder pain.  No head injury or loss of consciousness.  No numbness.   Past Medical History:  Diagnosis Date   GERD (gastroesophageal reflux disease)    Hearing impaired    HLD (hyperlipidemia)    Hypertension    IBS (irritable bowel syndrome)    Kidney stone     Review of Systems  Constitutional: No fever/chills Cardiovascular: Denies chest pain. Respiratory: Denies shortness of breath. Gastrointestinal: No abdominal pain.  Genitourinary: Negative for dysuria. Musculoskeletal: Positive left wrist pain/swelling.  Skin: Negative for rash. Neurological: Negative for headaches, focal weakness or numbness.  ____________________________________________   PHYSICAL EXAM:  VITAL SIGNS: ED Triage Vitals  Encounter Vitals Group     BP 11/14/22 1309 (!) 136/57     Pulse Rate 11/14/22 1309 (!) 53     Resp 11/14/22 1309 18     Temp 11/14/22 1309 98 F (36.7 C)     Temp Source 11/14/22 1309 Oral     SpO2 11/14/22 1309 97 %     Weight 11/14/22 1309 92 lb 9.5 oz (42 kg)     Height 11/14/22 1309 4\' 10"  (1.473 m)   Constitutional: Alert and oriented. Well appearing and in no acute distress. Eyes: Conjunctivae are normal. Head: Atraumatic. Nose: No congestion/rhinnorhea. Mouth/Throat: Mucous membranes are moist.  Neck: No stridor.   Cardiovascular: Normal rate, regular rhythm. Good peripheral circulation in the LUE.  Respiratory: Normal  respiratory effort.   Gastrointestinal: No distention.  Musculoskeletal: Mild swelling to the distal radius on the left.  Compartments in the forearm are soft.  Mild tenderness over the olecranon process on the left.  Normal range of motion of the fingers. Neurologic:  Normal speech and language. No gross focal neurologic deficits are appreciated.  Skin:  Skin is warm, dry and intact. No rash noted.  ____________________________________________   PROCEDURES  Procedure(s) performed:   Procedures  None ____________________________________________   INITIAL IMPRESSION / ASSESSMENT AND PLAN / ED COURSE  Pertinent labs & imaging results that were available during my care of the patient were reviewed by me and considered in my medical decision making (see chart for details).   This patient is Presenting for Evaluation of wrist pain, which does require a range of treatment options, and is a complaint that involves a moderate risk of morbidity and mortality.  The Differential Diagnoses include fracture, dislocation, contusion, compartment syndrome, etc.  Critical Interventions-    Medications  oxyCODONE-acetaminophen (PERCOCET/ROXICET) 5-325 MG per tablet 1 tablet (1 tablet Oral Given 11/14/22 1330)    Reassessment after intervention: pain improved.   Radiologic Tests Ordered, included wrist XR. I independently interpreted the images and agree with radiology interpretation.   Social Determinants of Health Risk is a non-smoker.    Medical Decision Making: Summary:  Patient presents emergency department with left wrist pain and swelling after fall on outstretched hand.  Mild swelling over the  distal radius.  Suspect possible fracture.  Reevaluation with update and discussion with patient. Wrist fracture discussed and splinted. Contact info given for hand surgery on call. No neurovascular compromise or skin changes. No open fracture. Compartments are soft.    Patient's presentation  is most consistent with acute presentation with potential threat to life or bodily function.   Disposition: discharge  ____________________________________________  FINAL CLINICAL IMPRESSION(S) / ED DIAGNOSES  Final diagnoses:  Closed fracture of distal end of left radius, unspecified fracture morphology, initial encounter     NEW OUTPATIENT MEDICATIONS STARTED DURING THIS VISIT:  Discharge Medication List as of 11/14/2022  2:22 PM     START taking these medications   Details  oxyCODONE-acetaminophen (PERCOCET/ROXICET) 5-325 MG tablet Take 1 tablet by mouth every 6 (six) hours as needed for severe pain., Starting Sat 11/14/2022, Normal    senna-docusate (SENOKOT-S) 8.6-50 MG tablet Take 1 tablet by mouth at bedtime as needed for mild constipation., Starting Sat 11/14/2022, Normal        Note:  This document was prepared using Dragon voice recognition software and may include unintentional dictation errors.  Alona Bene, MD, [facility] Emergency Medicine    Manal Kreutzer, Arlyss Repress, MD 11/24/22 [phone]

## 2022-11-14 NOTE — Discharge Instructions (Addendum)
You were seen in the emergency department today with a broken wrist.  Please keep this elevated above the level of your heart to help reduce swelling.  You may apply a cool compress to the outside of the splint but please keep the splint clean and dry.  I would like for you to call the orthopedic doctor listed, or one of your choice, on Monday for a follow-up appointment as this may require further treatment.

## 2022-11-14 NOTE — ED Triage Notes (Signed)
The patient tripped over a dog. She has left wrist pain and swelling. She denied head injury or LOC. No blood thinner use.

## 2022-11-16 ENCOUNTER — Encounter (HOSPITAL_BASED_OUTPATIENT_CLINIC_OR_DEPARTMENT_OTHER): Payer: Self-pay | Admitting: Orthopedic Surgery

## 2022-11-16 ENCOUNTER — Other Ambulatory Visit: Payer: Self-pay

## 2022-11-16 ENCOUNTER — Telehealth: Payer: Self-pay | Admitting: Orthopedic Surgery

## 2022-11-16 ENCOUNTER — Ambulatory Visit (INDEPENDENT_AMBULATORY_CARE_PROVIDER_SITE_OTHER): Admitting: Orthopedic Surgery

## 2022-11-16 DIAGNOSIS — S52532A Colles' fracture of left radius, initial encounter for closed fracture: Secondary | ICD-10-CM
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
## 2022-11-16 NOTE — Telephone Encounter (Signed)
Left message for patient about being seen today.  Ok to work in today.

## 2022-11-16 NOTE — Progress Notes (Signed)
   11/16/22 1314  PAT Phone Screen  Is the patient taking a GLP-1 receptor agonist? No  Do You Have Diabetes? No  Do You Have Hypertension? Yes  Have You Ever Been to the ER for Asthma? No  Have You Taken Oral Steroids in the Past 3 Months? No  Do you Take Phenteramine or any Other Diet Drugs? No  Recent  Lab Work, EKG, CXR? No  Do you have a history of heart problems? No  Cardiologist Name (S)  OV w/ Dr Bing Matter for evaluation of intermittent chest pain 06/2021. CT angio done showing only minimal disease to LAD-  Plavix was initially ordered at this appt but was discontinued >12 months ago. Per pt she was told to d/c plavix and to f/u in needed. Pt is very active walking / swimming daily and denies any cp/ sob.  Have you ever had tests on your heart? Yes  What cardiac tests were performed? (S)  Echo  What date/year were cardiac tests completed? (S)  ECHO 04/25/21 EF 60-65%  Results viewable: [facility] [employer] Tab  Any Recent Hospitalizations? No  Height 4\' 10"  (1.473 m)  Weight 42 kg  Pat Appointment Scheduled No  Reason for No Appointment  (unable to come today will need EKG in Pre op.)   Pt's pmh reviewed with Dr Nance Pew. Ok to proceed w/ surgery at [facility].
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
## 2022-11-17 ENCOUNTER — Encounter (HOSPITAL_BASED_OUTPATIENT_CLINIC_OR_DEPARTMENT_OTHER): Payer: Self-pay | Admitting: Orthopedic Surgery

## 2022-11-17 ENCOUNTER — Ambulatory Visit (HOSPITAL_BASED_OUTPATIENT_CLINIC_OR_DEPARTMENT_OTHER)

## 2022-11-17 ENCOUNTER — Ambulatory Visit (HOSPITAL_BASED_OUTPATIENT_CLINIC_OR_DEPARTMENT_OTHER): Admitting: Anesthesiology

## 2022-11-17 ENCOUNTER — Other Ambulatory Visit: Payer: Self-pay

## 2022-11-17 ENCOUNTER — Encounter (HOSPITAL_BASED_OUTPATIENT_CLINIC_OR_DEPARTMENT_OTHER)
Admission: RE | Disposition: A | Discharge status: Home / Self Care | Payer: Self-pay | Source: Home / Self Care | Attending: Orthopedic Surgery

## 2022-11-17 ENCOUNTER — Other Ambulatory Visit: Payer: Self-pay | Admitting: Orthopedic Surgery

## 2022-11-17 ENCOUNTER — Ambulatory Visit (HOSPITAL_BASED_OUTPATIENT_CLINIC_OR_DEPARTMENT_OTHER)
Admission: RE | Admit: 2022-11-17 | Discharge: 2022-11-17 | Disposition: A | Discharge status: Home / Self Care | Attending: Orthopedic Surgery | Admitting: Orthopedic Surgery

## 2022-11-17 DIAGNOSIS — S52532A Colles' fracture of left radius, initial encounter for closed fracture: Secondary | ICD-10-CM | POA: Diagnosis not present

## 2022-11-17 DIAGNOSIS — I10 Essential (primary) hypertension: Secondary | ICD-10-CM | POA: Diagnosis not present

## 2022-11-17 DIAGNOSIS — S52502K Unspecified fracture of the lower end of left radius, subsequent encounter for closed fracture with nonunion: Secondary | ICD-10-CM | POA: Insufficient documentation

## 2022-11-17 DIAGNOSIS — Z90710 Acquired absence of both cervix and uterus: Secondary | ICD-10-CM | POA: Diagnosis not present

## 2022-11-17 DIAGNOSIS — S52612K Displaced fracture of left ulna styloid process, subsequent encounter for closed fracture with nonunion: Secondary | ICD-10-CM | POA: Insufficient documentation

## 2022-11-17 DIAGNOSIS — S52502A Unspecified fracture of the lower end of left radius, initial encounter for closed fracture: Secondary | ICD-10-CM

## 2022-11-17 DIAGNOSIS — Z01818 Encounter for other preprocedural examination: Secondary | ICD-10-CM

## 2022-11-17 DIAGNOSIS — Z8249 Family history of ischemic heart disease and other diseases of the circulatory system: Secondary | ICD-10-CM | POA: Diagnosis not present

## 2022-11-17 DIAGNOSIS — W010XXA Fall on same level from slipping, tripping and stumbling without subsequent striking against object, initial encounter: Secondary | ICD-10-CM | POA: Insufficient documentation

## 2022-11-17 DIAGNOSIS — Z9049 Acquired absence of other specified parts of digestive tract: Secondary | ICD-10-CM | POA: Diagnosis not present

## 2022-11-17 DIAGNOSIS — K219 Gastro-esophageal reflux disease without esophagitis: Secondary | ICD-10-CM | POA: Insufficient documentation

## 2022-11-17 HISTORY — DX: Essential (primary) hypertension: I10

## 2022-11-17 HISTORY — DX: Hyperlipidemia, unspecified: E78.5

## 2022-11-17 HISTORY — DX: Irritable bowel syndrome, unspecified: K58.9

## 2022-11-17 HISTORY — PX: OPEN REDUCTION INTERNAL FIXATION (ORIF) DISTAL RADIAL FRACTURE: SHX5989

## 2022-11-17 SURGERY — OPEN REDUCTION INTERNAL FIXATION (ORIF) DISTAL RADIUS FRACTURE
Anesthesia: Regional | Site: Wrist | Laterality: Left

## 2022-11-17 MED ORDER — CEFAZOLIN SODIUM-DEXTROSE 2-4 GM/100ML-% IV SOLN
2.0000 g | INTRAVENOUS | Status: AC
  Administered 2022-11-17: 2 g via INTRAVENOUS

## 2022-11-17 MED ORDER — PROPOFOL 10 MG/ML IV BOLUS
INTRAVENOUS | Status: AC
  Filled 2022-11-17: qty 20

## 2022-11-17 MED ORDER — CEFAZOLIN SODIUM-DEXTROSE 2-4 GM/100ML-% IV SOLN
INTRAVENOUS | Status: AC
  Filled 2022-11-17: qty 100

## 2022-11-17 MED ORDER — MIDAZOLAM HCL 2 MG/2ML IJ SOLN
INTRAMUSCULAR | Status: AC
  Filled 2022-11-17: qty 2

## 2022-11-17 MED ORDER — PROPOFOL 10 MG/ML IV BOLUS
INTRAVENOUS | Status: DC | PRN
  Administered 2022-11-17: 120 mg via INTRAVENOUS

## 2022-11-17 MED ORDER — FENTANYL CITRATE (PF) 100 MCG/2ML IJ SOLN
INTRAMUSCULAR | Status: AC
  Filled 2022-11-17: qty 2

## 2022-11-17 MED ORDER — FENTANYL CITRATE (PF) 100 MCG/2ML IJ SOLN
100.0000 ug | Freq: Once | INTRAMUSCULAR | Status: AC
  Administered 2022-11-17: 50 ug via INTRAVENOUS

## 2022-11-17 MED ORDER — OXYCODONE HCL 5 MG PO TABS: 5.0000 mg | ORAL_TABLET | Freq: Once | ORAL | Status: DC | PRN

## 2022-11-17 MED ORDER — DEXAMETHASONE SODIUM PHOSPHATE 4 MG/ML IJ SOLN
INTRAMUSCULAR | Status: DC | PRN
  Administered 2022-11-17: 5 mg via INTRAVENOUS

## 2022-11-17 MED ORDER — EPHEDRINE 5 MG/ML INJ
INTRAVENOUS | Status: AC
  Filled 2022-11-17: qty 5

## 2022-11-17 MED ORDER — 0.9 % SODIUM CHLORIDE (POUR BTL) OPTIME
TOPICAL | Status: DC | PRN
Start: 2022-11-17 — End: 2022-11-17
  Administered 2022-11-17: 200 mL

## 2022-11-17 MED ORDER — MIDAZOLAM HCL 5 MG/5ML IJ SOLN
INTRAMUSCULAR | Status: DC | PRN
  Administered 2022-11-17: 1 mg via INTRAVENOUS

## 2022-11-17 MED ORDER — MIDAZOLAM HCL 2 MG/2ML IJ SOLN: 0.5000 mg | Freq: Once | INTRAMUSCULAR | Status: DC | PRN

## 2022-11-17 MED ORDER — OXYCODONE-ACETAMINOPHEN 5-325 MG PO TABS
1.0000 | ORAL_TABLET | Freq: Four times a day (QID) | ORAL | 0 refills | Status: AC | PRN
Start: 2022-11-17 — End: ?

## 2022-11-17 MED ORDER — MIDAZOLAM HCL 2 MG/2ML IJ SOLN
2.0000 mg | Freq: Once | INTRAMUSCULAR | Status: AC
  Administered 2022-11-17: 1 mg via INTRAVENOUS

## 2022-11-17 MED ORDER — LACTATED RINGERS IV SOLN: INTRAVENOUS | Status: DC

## 2022-11-17 MED ORDER — PHENYLEPHRINE 80 MCG/ML (10ML) SYRINGE FOR IV PUSH (FOR BLOOD PRESSURE SUPPORT)
PREFILLED_SYRINGE | INTRAVENOUS | Status: DC | PRN
Start: 2022-11-17 — End: 2022-11-17
  Administered 2022-11-17 (×2): 80 ug via INTRAVENOUS

## 2022-11-17 MED ORDER — PHENYLEPHRINE 80 MCG/ML (10ML) SYRINGE FOR IV PUSH (FOR BLOOD PRESSURE SUPPORT)
PREFILLED_SYRINGE | INTRAVENOUS | Status: AC
  Filled 2022-11-17: qty 10

## 2022-11-17 MED ORDER — BUPIVACAINE-EPINEPHRINE (PF) 0.5% -1:200000 IJ SOLN
INTRAMUSCULAR | Status: DC | PRN
Start: 2022-11-17 — End: 2022-11-17
  Administered 2022-11-17: 20 mL via PERINEURAL

## 2022-11-17 MED ORDER — ONDANSETRON HCL 4 MG/2ML IJ SOLN
INTRAMUSCULAR | Status: AC
  Filled 2022-11-17: qty 2

## 2022-11-17 MED ORDER — LIDOCAINE HCL (CARDIAC) PF 100 MG/5ML IV SOSY
PREFILLED_SYRINGE | INTRAVENOUS | Status: DC | PRN
  Administered 2022-11-17: 40 mg via INTRAVENOUS

## 2022-11-17 MED ORDER — PROPOFOL 500 MG/50ML IV EMUL
INTRAVENOUS | Status: DC | PRN
Start: 2022-11-17 — End: 2022-11-17
  Administered 2022-11-17: 50 ug/kg/min via INTRAVENOUS

## 2022-11-17 MED ORDER — FENTANYL CITRATE (PF) 100 MCG/2ML IJ SOLN
INTRAMUSCULAR | Status: DC | PRN
  Administered 2022-11-17: 25 ug via INTRAVENOUS

## 2022-11-17 MED ORDER — HYDROMORPHONE HCL 1 MG/ML IJ SOLN: 0.2500 mg | INTRAMUSCULAR | Status: DC | PRN

## 2022-11-17 MED ORDER — OXYCODONE HCL 5 MG/5ML PO SOLN: 5.0000 mg | Freq: Once | ORAL | Status: DC | PRN

## 2022-11-17 MED ORDER — MEPERIDINE HCL 25 MG/ML IJ SOLN: 6.2500 mg | INTRAMUSCULAR | Status: DC | PRN

## 2022-11-17 MED ORDER — LIDOCAINE 2% (20 MG/ML) 5 ML SYRINGE
INTRAMUSCULAR | Status: AC
  Filled 2022-11-17: qty 5

## 2022-11-17 MED ORDER — EPHEDRINE SULFATE (PRESSORS) 50 MG/ML IJ SOLN
INTRAMUSCULAR | Status: DC | PRN
  Administered 2022-11-17: 10 mg via INTRAVENOUS
  Administered 2022-11-17 (×2): 5 mg via INTRAVENOUS

## 2022-11-17 MED ORDER — ONDANSETRON HCL 4 MG/2ML IJ SOLN
INTRAMUSCULAR | Status: DC | PRN
  Administered 2022-11-17: 4 mg via INTRAVENOUS

## 2022-11-17 SURGICAL SUPPLY — 67 items
APL PRP STRL LF DISP 70% ISPRP (MISCELLANEOUS) ×1
BIT DRILL SOLID 2.0X40MM (BIT) IMPLANT
BIT DRILL SOLID 2.5X40MM (BIT) IMPLANT
BLADE SURG 15 STRL LF DISP TIS (BLADE) ×2 IMPLANT
BLADE SURG 15 STRL SS (BLADE) ×2
BNDG CMPR 5X4 CHSV STRCH STRL (GAUZE/BANDAGES/DRESSINGS) ×1
BNDG CMPR 5X4 KNIT ELC UNQ LF (GAUZE/BANDAGES/DRESSINGS) ×2
BNDG CMPR 75X21 PLY HI ABS (MISCELLANEOUS) ×1
BNDG CMPR 9X4 STRL LF SNTH (GAUZE/BANDAGES/DRESSINGS) ×1
BNDG COHESIVE 4X5 TAN STRL LF (GAUZE/BANDAGES/DRESSINGS) ×1 IMPLANT
BNDG ELASTIC 4INX 5YD STR LF (GAUZE/BANDAGES/DRESSINGS) ×2 IMPLANT
BNDG ESMARK 4X9 LF (GAUZE/BANDAGES/DRESSINGS) ×1 IMPLANT
CHLORAPREP W/TINT 26 (MISCELLANEOUS) ×1 IMPLANT
CORD BIPOLAR FORCEPS 12FT (ELECTRODE) ×1 IMPLANT
COVER BACK TABLE 60X90IN (DRAPES) ×1 IMPLANT
CUFF TOURN SGL QUICK 18X4 (TOURNIQUET CUFF) ×1 IMPLANT
CUFF TOURN SGL QUICK 24 (TOURNIQUET CUFF)
CUFF TRNQT CYL 24X4X16.5-23 (TOURNIQUET CUFF) IMPLANT
DRAPE EXTREMITY T 121X128X90 (DISPOSABLE) ×1 IMPLANT
DRAPE OEC MINIVIEW 54X84 (DRAPES) ×1 IMPLANT
DRAPE SURG 17X23 STRL (DRAPES) ×1 IMPLANT
DRILL SOLID 2.0X40MM (BIT) ×1
DRILL SOLID 2.5X40MM (BIT) ×1
GAUZE PAD ABD 8X10 STRL (GAUZE/BANDAGES/DRESSINGS) ×1 IMPLANT
GAUZE SPONGE 4X4 12PLY STRL (GAUZE/BANDAGES/DRESSINGS) ×1 IMPLANT
GAUZE STRETCH 2X75IN STRL (MISCELLANEOUS) ×1 IMPLANT
GAUZE XEROFORM 1X8 LF (GAUZE/BANDAGES/DRESSINGS) ×1 IMPLANT
GLOVE BIO SURGEON STRL SZ7.5 (GLOVE) ×2 IMPLANT
GLOVE BIOGEL PI IND STRL 7.5 (GLOVE) ×2 IMPLANT
GOWN STRL REUS W/ TWL LRG LVL3 (GOWN DISPOSABLE) ×2 IMPLANT
GOWN STRL REUS W/ TWL XL LVL3 (GOWN DISPOSABLE) IMPLANT
GOWN STRL REUS W/TWL LRG LVL3 (GOWN DISPOSABLE)
GOWN STRL REUS W/TWL XL LVL3 (GOWN DISPOSABLE) ×2 IMPLANT
GOWN STRL SURGICAL XL XLNG (GOWN DISPOSABLE) IMPLANT
GUIDE AIMING 1.5MM (WIRE) IMPLANT
MANIFOLD NEPTUNE II (INSTRUMENTS) ×1 IMPLANT
NDL HYPO 25X1 1.5 SAFETY (NEEDLE) IMPLANT
NEEDLE HYPO 25X1 1.5 SAFETY (NEEDLE)
NS IRRIG 1000ML POUR BTL (IV SOLUTION) IMPLANT
PACK BASIN DAY SURGERY FS (CUSTOM PROCEDURE TRAY) ×1 IMPLANT
PAD CAST 4YDX4 CTTN HI CHSV (CAST SUPPLIES) ×2 IMPLANT
PADDING CAST COTTON 4X4 STRL (CAST SUPPLIES) ×1
PEG GEMINUS THRD LOCK 2.3X14 (Peg) IMPLANT
PEG GEMINUS THRD LOCK 2.3X17 (Peg) IMPLANT
PEG GEMINUS THRD TPNL 2.7X20 (Peg) IMPLANT
PEG THREADED LOCK 2.3X16 (Screw) IMPLANT
PLATE GEMINUS DIST RAD LTN 4HL (Plate) IMPLANT
SCREW CORT LOCK 3.5X10 TI (Screw) IMPLANT
SCREW GEMINUS CORT LOCK 3.5X11 (Screw) IMPLANT
SCREW NONLOCK POLYAXIAL 3.5X10 (Screw) IMPLANT
SCREW NONLOCK POLYAXIAL 3.5X11 (Screw) IMPLANT
SCREWDRIVER SURG ST 2 (INSTRUMENTS) IMPLANT
SHEET MEDIUM DRAPE 40X70 STRL (DRAPES) ×1 IMPLANT
SLEEVE SCD COMPRESS KNEE MED (STOCKING) IMPLANT
SPIKE FLUID TRANSFER (MISCELLANEOUS) IMPLANT
SPLINT PLASTER CAST XFAST 4X15 (CAST SUPPLIES) ×10 IMPLANT
STOCKINETTE IMPERVIOUS LG (DRAPES) ×1 IMPLANT
SUCTION TUBE FRAZIER 10FR DISP (SUCTIONS) ×1 IMPLANT
SUT ETHILON 4 0 PS 2 18 (SUTURE) ×1 IMPLANT
SUT MNCRL AB 3-0 PS2 27 (SUTURE) ×1 IMPLANT
SUT VIC AB 4-0 PS2 18 (SUTURE) IMPLANT
SYR BULB EAR ULCER 3OZ GRN STR (SYRINGE) ×2 IMPLANT
SYR CONTROL 10ML LL (SYRINGE) IMPLANT
TOWEL GREEN STERILE FF (TOWEL DISPOSABLE) ×2 IMPLANT
TUBE CONNECTING 20X1/4 (TUBING) ×1 IMPLANT
WIRE FIX 1.5 STANDARD TIP (WIRE) ×2
WIRE FIX 1.5 STD TIP (WIRE) IMPLANT

## 2022-11-17 NOTE — Progress Notes (Signed)
Assisted Dr. Carswell Jackson with left, interscalene , ultrasound guided block. Side rails up, monitors on throughout procedure. See vital signs in flow sheet. Tolerated Procedure well. 

## 2022-11-17 NOTE — Anesthesia Procedure Notes (Signed)
Procedure Name: LMA Insertion Date/Time: 11/17/2022 10:38 AM  Performed by: Cleda Clarks, CRNAPre-anesthesia Checklist: Patient identified, Emergency Drugs available, Suction available and Patient being monitored Patient Re-evaluated:Patient Re-evaluated prior to induction Oxygen Delivery Method: Circle system utilized Preoxygenation: Pre-oxygenation with 100% oxygen Induction Type: IV induction Ventilation: Mask ventilation without difficulty LMA: LMA inserted LMA Size: 3.0 Number of attempts: 1 Placement Confirmation: positive ETCO2 Tube secured with: Tape Dental Injury: Teeth and Oropharynx as per pre-operative assessment

## 2022-11-17 NOTE — Anesthesia Preprocedure Evaluation (Addendum)
Anesthesia Evaluation  Patient identified by MRN, date of birth, ID band Patient awake    Reviewed: Allergy & Precautions, NPO status , Patient's Chart, lab work & pertinent test results, reviewed documented beta blocker date and time   History of Anesthesia Complications Negative for: history of anesthetic complications  Airway Mallampati: II  TM Distance: >3 FB Neck ROM: Full    Dental  (+) Dental Advisory Given   Pulmonary neg pulmonary ROS   breath sounds clear to auscultation       Cardiovascular hypertension, Pt. on medications and Pt. on home beta blockers (-) angina + CAD (non-obstructive)   Rhythm:Regular Rate:Normal  '23 Cardiac CT:  CAD-RADS 1. Minimal non-obstructive CAD (0-24%)  '23 ECHO: EF 60-65%.  1. The LV has normal function, no regional wall motion abnormalities. Left ventricular diastolic parameters were normal.   2. RVF is normal. The right ventricular size is normal. There is normal pulmonary artery systolic pressure.   3. The mitral valve is normal in structure. No evidence of MR. No evidence of mitral stenosis.   4. The aortic valve is normal in structure. Aortic valve regurgitation is not visualized. No aortic stenosis is present.     Neuro/Psych  Headaches Cochlear implant    GI/Hepatic Neg liver ROS,GERD  Controlled,,  Endo/Other  negative endocrine ROS    Renal/GU H/o stones     Musculoskeletal  (+) Arthritis ,    Abdominal   Peds  Hematology negative hematology ROS (+)   Anesthesia Other Findings   Reproductive/Obstetrics                             Anesthesia Physical Anesthesia Plan  ASA: 2  Anesthesia Plan: Regional and General   Post-op Pain Management: Regional block* and Tylenol PO (pre-op)*   Induction: Intravenous  PONV Risk Score and Plan: 3 and Ondansetron, Dexamethasone and Treatment may vary due to age or medical condition  Airway  Management Planned: LMA  Additional Equipment: None  Intra-op Plan:   Post-operative Plan:   Informed Consent: I have reviewed the patients History and Physical, chart, labs and discussed the procedure including the risks, benefits and alternatives for the proposed anesthesia with the patient or authorized representative who has indicated his/her understanding and acceptance.     Dental advisory given  Plan Discussed with: CRNA and Surgeon  Anesthesia Plan Comments: (Plan routine monitors, GA- LMA, with supraclavicular block for post op analgesia)        Anesthesia Quick Evaluation

## 2022-11-17 NOTE — Transfer of Care (Signed)
Immediate Anesthesia Transfer of Care Note  Patient: Joan Horn  Procedure(s) Performed: LEFT OPEN REDUCTION INTERNAL FIXATION (ORIF) DISTAL RADIUS FRACTURE (Left: Wrist)  Patient Location: PACU  Anesthesia Type:General  Level of Consciousness: drowsy  Airway & Oxygen Therapy: Patient Spontanous Breathing and Patient connected to face mask oxygen  Post-op Assessment: Report given to RN and Post -op Vital signs reviewed and stable  Post vital signs: Reviewed and stable  Last Vitals:  Vitals Value Taken Time  BP 115/59 11/17/22 1210  Temp    Pulse 58 11/17/22 1211  Resp 13 11/17/22 1211  SpO2 99 % 11/17/22 1211  Vitals shown include unfiled device data.  Last Pain:  Vitals:   11/17/22 0859  TempSrc: Temporal  PainSc: 5          Complications: No notable events documented.

## 2022-11-17 NOTE — Op Note (Signed)
NAME: Nikie Cid MEDICAL RECORD NO: [id number] DATE OF BIRTH: May 06, 1960 FACILITY: [facility] LOCATION: [facility] PHYSICIAN: Samuella Cota, MD   OPERATIVE REPORT   DATE OF PROCEDURE: 11/17/22    PREOPERATIVE DIAGNOSIS: Left distal radius fracture, associated ulnar styloid fracture   POSTOPERATIVE DIAGNOSIS: Left distal radius fracture, associated ulnar styloid fracture   PROCEDURE: Left distal radius fracture open reduction internal fixation Closed treatment ulnar styloid fracture   SURGEON:  Kourtland Coopman, M.D.   ASSISTANT: None   ANESTHESIA:  Regional with sedation and A regional block had been performed by anesthesia in preoperative holding.     INTRAVENOUS FLUIDS:  Per anesthesia flow sheet.   ESTIMATED BLOOD LOSS:  Minimal.   COMPLICATIONS:  None.   SPECIMENS:  none   TOURNIQUET TIME:    Total Tourniquet Time Documented: Upper Arm (Left) - 41 minutes Total: Upper Arm (Left) - 41 minutes    DISPOSITION:  Stable to PACU.   INDICATIONS: This is a 62 year old female who sustained a left distal radius fracture as part of a mechanical fall onto her outstretched left wrist.  She was seen in the emergency department setting, underwent temporizing splint application and was given close orthopedic follow-up.  Upon evaluation in the orthopedic clinic, she was noted to have a displaced, dorsal angulated, shortened distal radius fracture of the left wrist.  In order to restore anatomy, promote healing, early mobilization, and maintain wrist range of motion and function, patient was indicated for left distal radius fracture and open reduction internal fixation.  She had also sustained an ulnar styloid fracture which was likely to be treated in closed manner.  Risks and benefits of surgery were discussed including the risks of infection, bleeding, scarring, stiffness, nerve injury, vascular injury, tendon injury, need for subsequent operation, , nonunion, malunion.   She voiced understanding of these risks and elected to proceed.  OPERATIVE COURSE: Patient was seen and identified in the preoperative area and marked appropriately.  Surgical consent had been signed. Preoperative IV antibiotic prophylaxis was given. She was transferred to the operating room and placed in supine position with the Left upper extremity on an arm board.  Sedation was induced by the anesthesiologist. A regional block had been performed by anesthesia in preoperative holding.    Left upper extremity was prepped and draped in normal sterile orthopedic fashion.  A surgical pause was performed between the surgeons, anesthesia, and operating room staff and all were in agreement as to the patient, procedure, and site of procedure.  Tourniquet was placed and padded appropriately to the left upper arm.  Longitudinal incision was designed over the volar radial aspect of the wrist, standard FCR approach was utilized.  15 blade was utilized for skin, blunt dissection was performed down, all neurovascular structures were protected in their entirety throughout.  Radial artery was identified and protected.  Pronator quadratus was elevated from distal radius in order to expose the underlying fracture site.  Fracture was exposed and carefully debrided to appropriately visualize.  Gentle reduction maneuver was performed of the fragments under biplanar fluoroscopy.  Once we were satisfied with our reduction, narrow 4-hole [employer] plate was selected and temporarily affixed to bone utilizing a combination of clamp and K wires.  Biplanar fluoroscopy was once again utilized to confirm appropriate plate placement and maintenance of fracture reduction.  Shaft screw was then placed to secure the plate proximally.  Nonlocking screw was utilized distally initially to capture the dorsal comminution and restore volar tilt.  Volar tilt was noted to be restored appropriately, length and plate placement were  appropriate.  Remaining distal holes were filled utilizing locking screws taking care to ensure all screws remained extra-articular and beneath the joint line.  Nonlocking screw distally was then swapped for a locking screw of shorter variety to once again ensure screw remained well within bone.  Shaft screws were then placed in the remaining holes in locking fashion.  Distal radioulnar joint was then tested and noted to be stable in all planes.  Decision made to proceed with closed treatment of the ulnar styloid fracture.  Final fluoroscopic views were taken utilizing biplanar fluoroscopy which confirmed appropriate plate placement, fracture was well-fixed, all screws were noted to be extra-articular in nature.  Gentle flexion and extension of the wrist under fluoroscopy indicated stable appearance of the wrist.  The tourniquet was deflated at 41 minutes.  Fingertips were pink with brisk capillary refill after deflation of tourniquet.  Copious irrigation was performed.  Layered closure was performed utilizing a combination of 3-0 Monocryl for subcutaneous layer and 4-0 nylon for the skin surface.  Sterile dressings were applied followed by application of a short arm volar slab splint utilizing plaster.  The operative drapes were broken down.  Sling was applied.  The patient was awoken from anesthesia safely and taken to PACU in stable condition.  I will see her back in the office in 2 weeks for postoperative followup.       Samuella Cota, MD Electronically signed, 11/17/22

## 2022-11-17 NOTE — Interval H&P Note (Signed)
History and Physical Interval Note:  11/17/2022 10:06 AM  Joan Horn  has presented today for surgery, with the diagnosis of LEFT DISTAL RADIUS FRACTURE.  The various methods of treatment have been discussed with the patient and family. After consideration of risks, benefits and other options for treatment, the patient has consented to  Procedure(s): LEFT OPEN REDUCTION INTERNAL FIXATION (ORIF) DISTAL RADIUS FRACTURE (Left) as a surgical intervention.  The patient's history has been reviewed, patient examined, no change in status, stable for surgery.  I have reviewed the patient's chart and labs.  Questions were answered to the patient's satisfaction.     Joan Horn

## 2022-11-17 NOTE — Anesthesia Procedure Notes (Signed)
Anesthesia Regional Block: Supraclavicular block   Pre-Anesthetic Checklist: , timeout performed,  Correct Patient, Correct Site, Correct Laterality,  Correct Procedure, Correct Position, site marked,  Risks and benefits discussed,  Surgical consent,  Pre-op evaluation,  At surgeon's request and post-op pain management  Laterality: Left and Upper  Prep: chloraprep       Needles:  Injection technique: Single-shot  Needle Type: Echogenic Needle     Needle Length: 9cm  Needle Gauge: 21     Additional Needles:   Procedures:,,,, ultrasound used (permanent image in chart),,    Narrative:  Start time: 11/17/2022 9:37 AM End time: 11/17/2022 9:43 AM Injection made incrementally with aspirations every 5 mL.  Performed by: Personally  Anesthesiologist: Jairo Ben, MD  Additional Notes: Pt identified in Holding room.  Monitors applied. Working IV access confirmed. Timeout, Sterile prep L clavicle and neck.  #21ga ECHOgenic Arrow block needle to supraclavicular brachial plexus with US guidance.  20cc 0.5% Bupivacaine 1:200k epi injected incrementally after negative test dose.  Patient asymptomatic, VSS, no heme aspirated, tolerated well.   Sandford Craze, MD

## 2022-11-17 NOTE — Anesthesia Postprocedure Evaluation (Signed)
Anesthesia Post Note  Patient: Eudelia Lager  Procedure(s) Performed: LEFT OPEN REDUCTION INTERNAL FIXATION (ORIF) DISTAL RADIUS FRACTURE (Left: Wrist)     Patient location during evaluation: PACU Anesthesia Type: Regional and General Level of consciousness: awake and alert, patient cooperative and oriented Pain management: pain level controlled Vital Signs Assessment: post-procedure vital signs reviewed and stable Respiratory status: spontaneous breathing, nonlabored ventilation and respiratory function stable Cardiovascular status: blood pressure returned to baseline and stable Postop Assessment: no apparent nausea or vomiting, able to ambulate and adequate PO intake Anesthetic complications: no   No notable events documented.  Last Vitals:  Vitals:   11/17/22 1230 11/17/22 1245  BP: 133/73 132/68  Pulse: 69 69  Resp: 12 12  Temp:    SpO2: 99% 96%    Last Pain:  Vitals:   11/17/22 1245  TempSrc:   PainSc: 0-No pain                 Salayah Meares,E. Avrum Kimball

## 2022-11-17 NOTE — Discharge Instructions (Addendum)
Post Anesthesia Home Care Instructions  Activity: Get plenty of rest for the remainder of the day. A responsible individual must stay with you for 24 hours following the procedure.  For the next 24 hours, DO NOT: -Drive a car -Advertising copywriter -Drink alcoholic beverages -Take any medication unless instructed by your physician -Make any legal decisions or sign important papers.  Meals: Start with liquid foods such as gelatin or soup. Progress to regular foods as tolerated. Avoid greasy, spicy, heavy foods. If nausea and/or vomiting occur, drink only clear liquids until the nausea and/or vomiting subsides. Call your physician if vomiting continues.  Special Instructions/Symptoms: Your throat may feel dry or sore from the anesthesia or the breathing tube placed in your throat during surgery. If this causes discomfort, gargle with warm salt water. The discomfort should disappear within 24 hours.  If you had a scopolamine patch placed behind your ear for the management of post- operative nausea and/or vomiting:  1. The medication in the patch is effective for 72 hours, after which it should be removed.  Wrap patch in a tissue and discard in the trash. Wash hands thoroughly with soap and water. 2. You may remove the patch earlier than 72 hours if you experience unpleasant side effects which may include dry mouth, dizziness or visual disturbances. 3. Avoid touching the patch. Wash your hands with soap and water after contact with the patch.       Regional Anesthesia Blocks  1. You may not be able to move or feel the "blocked" extremity after a regional anesthetic block. This may last may last from 3-48 hours after placement, but it will go away. The length of time depends on the medication injected and your individual response to the medication. As the nerves start to wake up, you may experience tingling as the movement and feeling returns to your extremity. If the numbness and inability to  move your extremity has not gone away after 48 hours, please call your surgeon.   2. The extremity that is blocked will need to be protected until the numbness is gone and the strength has returned. Because you cannot feel it, you will need to take extra care to avoid injury. Because it may be weak, you may have difficulty moving it or using it. You may not know what position it is in without looking at it while the block is in effect.  3. For blocks in the legs and feet, returning to weight bearing and walking needs to be done carefully. You will need to wait until the numbness is entirely gone and the strength has returned. You should be able to move your leg and foot normally before you try and bear weight or walk. You will need someone to be with you when you first try to ensure you do not fall and possibly risk injury.  4. Bruising and tenderness at the needle site are common side effects and will resolve in a few days.  5. Persistent numbness or new problems with movement should be communicated to the surgeon or the [facility] [phone] [facility] [phone]).       Hand Surgery Postop Instructions   Dressings: Maintain postoperative dressing until orthopedic follow-up.  Keep operative site clean and dry until orthopedic follow-up.  Wound Care: Keep your hand elevated above the level of your heart.  Do not allow it to dangle by your side. Moving your fingers is advised to stimulate circulation but will depend  on the site of your surgery.  If you have a splint applied, your doctor will advise you regarding movement.  Activity: Do not drive or operate machinery until clearance given from physician. No heavy lifting with operative extremity.  Diet:  Drink liquids today or eat a light diet.  You may resume a regular diet tomorrow.    General expectations: Take prescribed medication if given, transition to over-the-counter medication as quickly as  possible. Fingers may become slightly swollen.  Call your doctor if any of the following occur: Severe pain not relieved by pain medication. Elevated temperature. Dressing soaked with blood. Inability to move fingers. White or bluish color to fingers.   Anshul Trevor Mace, M.D. Hand Surgery [facility]

## 2022-11-18 ENCOUNTER — Encounter (HOSPITAL_BASED_OUTPATIENT_CLINIC_OR_DEPARTMENT_OTHER): Payer: Self-pay | Admitting: Orthopedic Surgery

## 2022-12-01 ENCOUNTER — Ambulatory Visit: Admitting: Orthopedic Surgery

## 2022-12-01 ENCOUNTER — Other Ambulatory Visit (INDEPENDENT_AMBULATORY_CARE_PROVIDER_SITE_OTHER)

## 2022-12-01 DIAGNOSIS — S52532A Colles' fracture of left radius, initial encounter for closed fracture: Secondary | ICD-10-CM | POA: Diagnosis not present

## 2022-12-01 NOTE — Progress Notes (Signed)
   Joan Horn - 62 y.o. female MRN [id number]  Date of birth: 1960/06/15  Office Visit Note: Visit Date: 12/01/2022 PCP: Adolph Pollack, FNP Referred by: Clide Dales, *  Subjective:  HPI: Joan Horn is a 62 y.o. female who presents today for follow up 2 weeks status post left distal radius open reduction internal fixation.  She is doing well overall, pain is well-controlled.  Denies any numbness or tingling.  Pertinent ROS were reviewed with the patient and found to be negative unless otherwise specified above in HPI.   Assessment & Plan: Visit Diagnoses:  1. Closed Colles' fracture of left radius, initial encounter     Plan: She is doing well postoperatively.  X-rays obtained today showed stable appearance of the distal radius fracture with appropriate hardware fixation.  Sutures removed.  Transition to a short arm cast today.  Follow-up in 2 weeks for repeat clinical and radiographic check.  At that juncture, she will see occupational therapy for fabrication of a removable orthosis and begin range of motion exercises.  Follow-up: No follow-ups on file.   Meds & Orders: No orders of the defined types were placed in this encounter.   Orders Placed This Encounter  Procedures   XR Wrist Complete Left     Procedures: No procedures performed       Objective:   Vital Signs: There were no vitals taken for this visit.  Ortho Exam Left wrist: - Well-healing volar incision, skin edges well-approximated, no erythema or drainage - Digital range of motion is intact, composite fist without significant restriction - Gentle wrist range of motion without significant crepitus or pain - Sensation intact distally, hand is warm well-perfused  Imaging: XR Wrist Complete Left  Result Date: 12/01/2022 X-rays of the left wrist, multiple views were completed today X-rays demonstrate stable appearance of the distal radius fracture with hardware fixation.  No evidence of  hardware failure or migration.  Ulnar styloid fracture is appreciated.    Joan Horn Trevor Mace, M.D. [facility] 4:45 PM

## 2022-12-10 NOTE — Therapy (Signed)
OUTPATIENT OCCUPATIONAL THERAPY ORTHO EVALUATION & DISCHARGE  Patient Name: Joan Horn MRN: [id number] DOB:1960-09-30, 62 y.o., female Today's Date: 12/14/2022  PCP: Loann Quill, FNP REFERRING PROVIDER: Samuella Cota, MD      OCCUPATIONAL THERAPY DISCHARGE SUMMARY  Patient is a 62 y.o. female who was seen today for occupational therapy evaluation for left wrist fracture, subsequent surgery and stiffness, pain, fear and guarding, decreased functional abilities.  OT did recommend being treated here for at least 6 weeks, but she refused to be treated here after today's initial visit due to traveling distance from her home.  OT was able to make her a custom wrist orthosis/immobilizer for protection after her cast was removed and give her initial home exercise program and recommendations.  Per her wishes she will be discharged from this therapist plan of care and she can seek future treatment near her home in [facility]   Fannie Knee, OTR/L, CHT 12/14/22     END OF SESSION:  OT End of Session - 12/14/22 1000     Visit Number 1    Number of Visits 1    Authorization Type [employer]    OT Start Time 1035    OT Stop Time 1107    OT Time Calculation (min) 32 min    Equipment Utilized During Treatment orthotic materials    Activity Tolerance Patient tolerated treatment well;Patient limited by pain;Patient limited by fatigue    Behavior During Therapy [facility] for tasks assessed/performed;Anxious             Past Medical History:  Diagnosis Date   GERD (gastroesophageal reflux disease)    Hearing impaired    HLD (hyperlipidemia)    Hypertension    IBS (irritable bowel syndrome)    Kidney stone    Past Surgical History:  Procedure Laterality Date   ABDOMINAL HYSTERECTOMY     CHOLECYSTECTOMY     COCHLEAR IMPLANT     x2   OPEN REDUCTION INTERNAL FIXATION (ORIF) DISTAL RADIAL FRACTURE Left 11/17/2022   Procedure: LEFT OPEN REDUCTION INTERNAL FIXATION (ORIF) DISTAL  RADIUS FRACTURE;  Surgeon: Samuella Cota, MD;  Location: [facility];  Service: Orthopedics;  Laterality: Left;   Patient Active Problem List   Diagnosis Date Noted   Fracture, Colles, left, closed 11/17/2022   Left flank pain 07/07/2021   Angina pectoris (HCC) 04/22/2021   Burn 04/21/2021   Sensory hearing loss, bilateral 04/21/2021   Otitis externa 04/21/2021   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 04/21/2021   Need for prophylactic hormone replacement therapy (postmenopausal) 04/21/2021   Bilateral shoulder pain 03/24/2021   Neck pain 03/24/2021   Acute cough 03/14/2021   Cochlear implant in place 03/14/2021   Low back pain 03/14/2021   Annual physical exam 01/06/2021   Influenza vaccine refused 01/06/2021   Hearing impaired person, bilateral 01/06/2021   DDD (degenerative disc disease), lumbar 07/05/2020   Lumbar radiculopathy 09/12/2019   Elevated hemoglobin (HCC) 12/20/2018   Sensorineural hearing loss, bilateral 06/28/2014   Osteoporosis 02/21/2014   Nephrolithiasis 11/02/2013   Hyperlipidemia 03/07/2013   Calculus of ureter 02/14/2013   Essential hypertension 02/01/2013   Gastro-esophageal reflux disease without esophagitis 08/01/2012   Irritable bowel syndrome without diarrhea 08/01/2012   Migraine, unspecified, not intractable, without status migrainosus 12/31/2011    ONSET DATE: 11/17/22 DOS  REFERRING DIAG: O84.166A (ICD-10-CM) - Closed Colles' fracture of left radius, initial encounter   THERAPY DIAG:  Localized edema  Muscle weakness (generalized)  Other lack of coordination  Pain in left wrist  Stiffness of left wrist, not elsewhere classified  Stiffness of left hand, not elsewhere classified  Rationale for Evaluation and Treatment: Rehabilitation  SUBJECTIVE:   SUBJECTIVE STATEMENT: She is  ~4 weeks post op L DRF and ORIF. She states confusion over her follow-up appointments, not making a follow-up appointment with  her doctor, not wanting to have therapy here but have therapy in [facility].  She is accompanied by her friend who does a lot of the talking for her.  She has a cochlear implant and apparent hearing deficit.  She appears guarded and painful with even slight active range of motion and light touch to the arm.   PERTINENT HISTORY: Per MD referral: "Will need OT/splint fabrication, DOS: 11/17/22- 4 weeks from this date, *please make for 10/28*- provider availability"  PRECAUTIONS: Fall  RED FLAGS: None   WEIGHT BEARING RESTRICTIONS: Yes NWB in Lt arm now   PAIN:  Are you having pain? Yes: NPRS scale: 4-5/10 at rest and with motion, up to 7/10 Pain location: Lt wrist Pain description: sore, aching Aggravating factors: motions, weight bearing Relieving factors: rest  FALLS: Has patient fallen in last 6 months? Yes. Number of falls at least 1 - this accident, claims sh eis "clumsy"   PLOF: Independent  PATIENT GOALS: To reduce pain and improve the use of her left nondominant arm  NEXT MD VISIT: TBD   OBJECTIVE: (All objective assessments below are from initial evaluation on: 12/14/22 unless otherwise specified.)   HAND DOMINANCE: Right   ADLs: Overall ADLs: States decreased ability to grab, hold household objects, pain and difficulty to open containers, perform FMS tasks (manipulate fasteners on clothing), mild to moderate bathing problems as well.    UPPER EXTREMITY ROM     Eval: Overt stiffness and weakness from the elbow to the fingers.  Details not tested today as she is not planning on coming here for therapy.  The main purpose of today's visit is to get a protective orthosis and exercise program.  UPPER EXTREMITY MMT:    Eval:  NT at eval due to recent and still healing injuries.  HAND FUNCTION: Eval: Observed weakness in affected Lt hand.   COORDINATION: Eval: Observed coordination impairments with affected Lt hand.  SENSATION: Eval:  Light touch intact today, though  diminished around sx area    EDEMA:   Eval:  Mildly swollen in Lt hand and wrist today  COGNITION: Eval: Overall cognitive status: WFL for evaluation today, some confusion about appointments and overt hearing difficulties  OBSERVATIONS:   Eval: Steri-Strips clean and intact no signs of infection, typical swelling and tenderness only.  She is overtly fearful and guarded holding her arm and seems to not want to look at or touch her arm.   TODAY'S TREATMENT:  Post-evaluation treatment:   Due to delay in treatment, patient's confusion with appointments, and her desire to not see therapy here at this location, today treatment mainly revolved around getting her protective orthosis for her wrist and getting her initial recommendations for safety and home exercise program.  OT did educate her for safety nonweightbearing, do not soak her wound will remove the Steri-Strips, avoid pushing pulling and gripping for another month at least, and that she should make a follow-up appointment with her doctor as soon as she can.  She was also educated on the following home exercise program which she should do nonpainful he 4-6 times a day as tolerated including scar desensitization and light touching  around the wound.  She was also educated that she can take a shower and dab dry but do not soak her wound.  Custom orthotic fabrication was indicated due to pt's Lt wrist fracture, ORIF and need for safe, functional positioning. OT fabricated custom wrist immobilization orthosis for pt today to protect and rest her left arm as it heals. It fit well with no areas of pressure, pt states a comfortable fit. Pt was educated on the wearing schedule (on at all times except for hygiene and exercises), to avoid exposing it to sources of heat, to wipe clean as needed (do not wash, use harsh detergents), to call or come in ASAP if it is causing any irritation or is not achieving desired function. It will be checked/adjusted in  upcoming sessions, as needed. Pt states understanding all directions.    Exercises - Reach arms upward   - 4 x daily - 10 reps - Standing Elbow Flexion Extension AROM  - 4-6 x daily - 10-15 reps - Turn [employer] Facing Up & Down  - 4-6 x daily - 10-15 reps - Bend and Pull Back Wrist SLOWLY  - 4 x daily - 10-15 reps - "Windshield Wipers"   - 4 x daily - 10-15 reps - Tendon Glides  - 4-6 x daily - 3-5 reps - 2-3 seconds hold - Thumb Opposition  - 4-6 x daily - 10 reps Patient Education - Scar Massage    PATIENT EDUCATION: Education details: See tx section above for details  Person educated: Patient Education method: Verbal Instruction, Teach back, Handouts  Education comprehension: States and demonstrates understanding, Additional Education required    HOME EXERCISE PROGRAM: Access Code: [email or web address] URL: [email or web address] Date: 12/14/2022 Prepared by: Fannie Knee   GOALS: Goals reviewed with patient? Yes   SHORT TERM GOALS: (STG required if POC>30 days) Target Date: 12/14/22  Pt will obtain protective, custom orthotic. Goal status:  MET   2.  Pt will demo/state understanding of initial HEP to improve pain levels and prerequisite motion. Goal status: Met    ASSESSMENT:  CLINICAL IMPRESSION: Patient is a 62 y.o. female who was seen today for occupational therapy evaluation for left wrist fracture, subsequent surgery and stiffness, pain, fear and guarding, decreased functional abilities.  OT did recommend being treated here for at least 6 weeks, but she refused to be treated here after today's initial visit due to traveling distance from her home.  OT was able to make her a custom wrist orthosis/immobilizer for protection after her cast was removed and give her initial home exercise program and recommendations.  Per her wishes she will be discharged from this therapist plan of care and she can seek future treatment near her home in [facility]   PERFORMANCE  DEFICITS: in functional skills including ADLs, IADLs, coordination, dexterity, edema, ROM, strength, pain, fascial restrictions, muscle spasms, flexibility, Fine motor control, Gross motor control, hearing, body mechanics, endurance, decreased knowledge of precautions, skin integrity, and UE functional use, cognitive skills including memory, problem solving, and safety awareness, and psychosocial skills including coping strategies, environmental adaptation, and habits.   IMPAIRMENTS: are limiting patient from ADLs, IADLs, rest and sleep, work, leisure, and social participation.   COMORBIDITIES: may have co-morbidities  that affects occupational performance. Patient will benefit from skilled OT to address above impairments and improve overall function.  MODIFICATION OR ASSISTANCE TO COMPLETE EVALUATION: No modification of tasks or assist necessary to complete an evaluation.  OT OCCUPATIONAL PROFILE AND HISTORY: Problem focused assessment: Including  review of records relating to presenting problem.  CLINICAL DECISION MAKING: LOW - limited treatment options, no task modification necessary  REHAB POTENTIAL: Excellent  EVALUATION COMPLEXITY: Low      PLAN:  OT FREQUENCY: one time visit  OT DURATION: 12/14/22- per pt request- 1-time visit   PLANNED INTERVENTIONS: 97535 self care/ADL training, 40981 therapeutic exercise, 97530 therapeutic activity, 97760 Orthotics management and training, 19147 Splinting (initial encounter), [phone] Subsequent splinting/medication, coping strategies training, and patient/family education  RECOMMENDED OTHER SERVICES: She should make an a follow-up appointment with her surgeon and also she should call and get therapy at a place closer to her home in [facility] as she states the intention to  CONSULTED AND AGREED WITH PLAN OF CARE: Patient  PLAN FOR NEXT SESSION: N/A this was a one-time visit per her request   Fannie Knee, OTR/L, CHT 12/14/2022, 11:37  AM

## 2022-12-14 ENCOUNTER — Other Ambulatory Visit: Payer: Self-pay

## 2022-12-14 ENCOUNTER — Telehealth: Payer: Self-pay | Admitting: Orthopedic Surgery

## 2022-12-14 ENCOUNTER — Encounter: Payer: Self-pay | Admitting: Rehabilitative and Restorative Service Providers"

## 2022-12-14 ENCOUNTER — Ambulatory Visit (INDEPENDENT_AMBULATORY_CARE_PROVIDER_SITE_OTHER): Admitting: Rehabilitative and Restorative Service Providers"

## 2022-12-14 DIAGNOSIS — M25532 Pain in left wrist: Secondary | ICD-10-CM

## 2022-12-14 DIAGNOSIS — M6281 Muscle weakness (generalized): Secondary | ICD-10-CM

## 2022-12-14 DIAGNOSIS — R600 Localized edema: Secondary | ICD-10-CM | POA: Diagnosis not present

## 2022-12-14 DIAGNOSIS — M25642 Stiffness of left hand, not elsewhere classified: Secondary | ICD-10-CM

## 2022-12-14 DIAGNOSIS — M25632 Stiffness of left wrist, not elsewhere classified: Secondary | ICD-10-CM

## 2022-12-14 DIAGNOSIS — R278 Other lack of coordination: Secondary | ICD-10-CM

## 2022-12-14 NOTE — Telephone Encounter (Signed)
Pt had surgery on 10/1 and needs follow up appt per OT please advise no avail appts until Monday, please call pt

## 2022-12-14 NOTE — Telephone Encounter (Signed)
This patient was added on to the clinic on 12/28/22.

## 2022-12-14 NOTE — Telephone Encounter (Signed)
Left message for her to call back. Needs to be seen when he is back either 11/7 or 11/8 I will need to schedule accordingly

## 2022-12-22 ENCOUNTER — Telehealth: Payer: Self-pay | Admitting: Orthopedic Surgery

## 2022-12-22 NOTE — Telephone Encounter (Signed)
Angela from [facility] called. Needs clarification on ROM. Her call back number is [phone]

## 2022-12-28 ENCOUNTER — Ambulatory Visit (INDEPENDENT_AMBULATORY_CARE_PROVIDER_SITE_OTHER): Admitting: Orthopedic Surgery

## 2022-12-28 ENCOUNTER — Other Ambulatory Visit (INDEPENDENT_AMBULATORY_CARE_PROVIDER_SITE_OTHER)

## 2022-12-28 DIAGNOSIS — S52532A Colles' fracture of left radius, initial encounter for closed fracture: Secondary | ICD-10-CM

## 2022-12-28 NOTE — Progress Notes (Signed)
   Joan Horn - 62 y.o. female MRN [id number]  Date of birth: Aug 01, 1960  Office Visit Note: Visit Date: 12/28/2022 PCP: Adolph Pollack, FNP Referred by: Adolph Pollack, FNP  Subjective:  HPI: Joan Horn is a 62 y.o. female who presents today for follow up 6 weeks status post left wrist ORIF.  She is doing very well postoperatively.  Is improving from a range of motion standpoint.  Pain is controlled.  Pertinent ROS were reviewed with the patient and found to be negative unless otherwise specified above in HPI.   Assessment & Plan: Visit Diagnoses:  1. Closed Colles' fracture of left radius, initial encounter     Plan: She is doing very well postoperatively.  X-rays reviewed today which show stable appearance of distal radius fracture status post open reduction internal fixation with appropriate interval healing.  Continue range of motion exercises for additional 2 weeks, begin strengthening at week 8 postoperative.  Follow-up in 6 weeks.  Follow-up: No follow-ups on file.   Meds & Orders: No orders of the defined types were placed in this encounter.   Orders Placed This Encounter  Procedures   XR Wrist Complete Left     Procedures: No procedures performed       Objective:   Vital Signs: There were no vitals taken for this visit.  Ortho Exam Left wrist: - Flexion 40 degrees, extension 30 degrees without significant pain - Full composite fist without restriction - Sensation intact distally in all distributions - Hand is warm well-perfused - No significant tenderness, well-healed volar incision  Imaging: XR Wrist Complete Left  Result Date: 12/28/2022 X-rays of the left wrist obtained today show stable appearance of the distal radius fracture status post open reduction internal fixation.  Hardware well-fixed, no evidence of failure or migration.    Esha Fincher Trevor Mace, M.D. [facility] 1:59 PM

## 2022-12-29 ENCOUNTER — Telehealth: Payer: Self-pay | Admitting: Orthopedic Surgery

## 2022-12-29 NOTE — Telephone Encounter (Signed)
12/28/22 progress note emailed to Magdalene Molly at [facility] at [email or web address], per request. Ph [phone]

## 2023-01-18 ENCOUNTER — Encounter: Admitting: Orthopedic Surgery

## 2023-02-08 ENCOUNTER — Ambulatory Visit: Admitting: Orthopedic Surgery

## 2023-02-08 ENCOUNTER — Other Ambulatory Visit (INDEPENDENT_AMBULATORY_CARE_PROVIDER_SITE_OTHER)

## 2023-02-08 DIAGNOSIS — S52532A Colles' fracture of left radius, initial encounter for closed fracture: Secondary | ICD-10-CM

## 2023-02-08 NOTE — Progress Notes (Signed)
   Joan Horn - 62 y.o. female MRN [id number]  Date of birth: Nov 25, 1960  Office Visit Note: Visit Date: 02/08/2023 PCP: Adolph Pollack, FNP Referred by: Adolph Pollack, FNP  Subjective:  HPI: Joan Horn is a 62 y.o. female who presents today for follow up 12 weeks status post left wrist ORIF.  She is doing very well overall, pain is well-controlled, is progressing nicely with range of motion and strengthening with therapy as instructed.  Pertinent ROS were reviewed with the patient and found to be negative unless otherwise specified above in HPI.   Assessment & Plan: Visit Diagnoses:  1. Closed Colles' fracture of left radius, initial encounter     Plan: She has done very well postoperatively.  X-rays obtained today demonstrate stable appearance of the distal radius fracture status post hardware fixation.  Clinically, she is doing very well from a range of motion standpoint.  At this juncture, she is cleared for activities as tolerated moving forward.  She can continue with therapy as long as it continues to help her, transition to a home exercise program when deemed appropriate.  Follow-up with me as needed.  She is very pleased with her outcome.  Follow-up: No follow-ups on file.   Meds & Orders: No orders of the defined types were placed in this encounter.   Orders Placed This Encounter  Procedures   XR Wrist Complete Left     Procedures: No procedures performed       Objective:   Vital Signs: There were no vitals taken for this visit.  Ortho Exam Left wrist: - Flexion/extension 55/45 - Pronation/supination 70/70 - DRUJ stable in all planes - Full digital motion, composite fist without restriction - Sensation intact in all distributions, hand remains warm well-perfused  Imaging: XR Wrist Complete Left Result Date: 02/08/2023 X-rays of the left wrist, multiple views were obtained today X-rays demonstrate stable appearance of the distal radius fracture  status post hardware fixation.  Hardware remains well-fixed in all planes without evidence of failure or migration.    Joan Horn Joan Horn, M.D. [facility] 10:23 AM

## 2023-03-13 IMAGING — CT CT HEART MORP W/ CTA COR W/ SCORE W/ CA W/CM &/OR W/O CM
4 of 7 series · 8 of 20 positions shown, 9 images · non-contrast
Comparison: None.
COMPARISON: None.

Addendum:
EXAM:
OVER-READ INTERPRETATION  CT CHEST

The following report is an over-read performed by radiologist Dr.
Shalley Jim [REDACTED] on 05/06/2021. This
over-read does not include interpretation of cardiac or coronary
anatomy or pathology. The coronary calcium score/coronary CTA
interpretation by the cardiologist is attached.
CLINICAL DATA: CP
Cardiac/Coronary  CTA
TECHNIQUE: The patient was scanned on a [employer] Force scanner.

[Series 6: ts syst sharp · axial · 0.39mm/px · z∈[+1288,+1329]mm · 2 of 306 slices shown]
[im 102/306  lung]
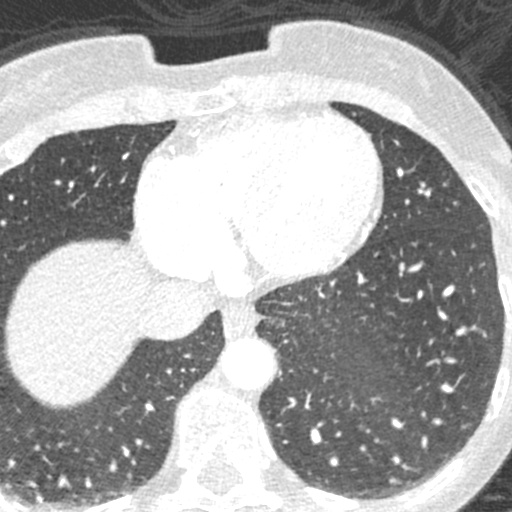
[im 204/306  lung]
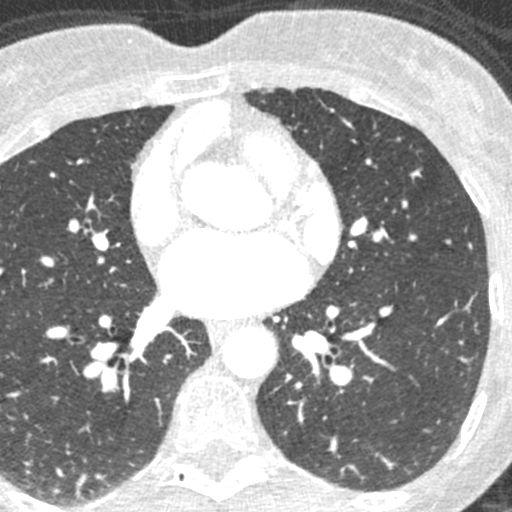

[Series 7: ts diast sharp · axial · 0.39mm/px · z∈[+1288,+1329]mm · 2 of 306 slices shown]
[im 102/306  lung]
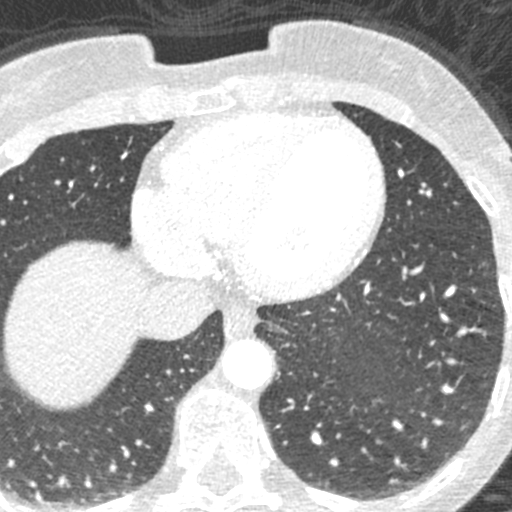
[im 204/306  lung]
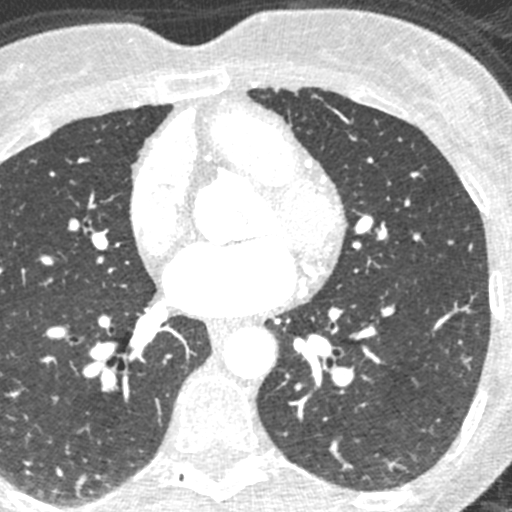

[Series 8: best syst · axial · 0.39mm/px · z∈[+1288,+1329]mm · 2 of 306 slices shown, 3 images]
[im 102/306  vessel]
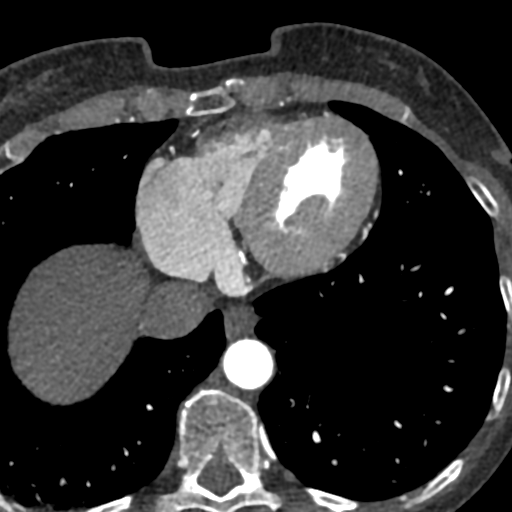
[im 102/306  lung]
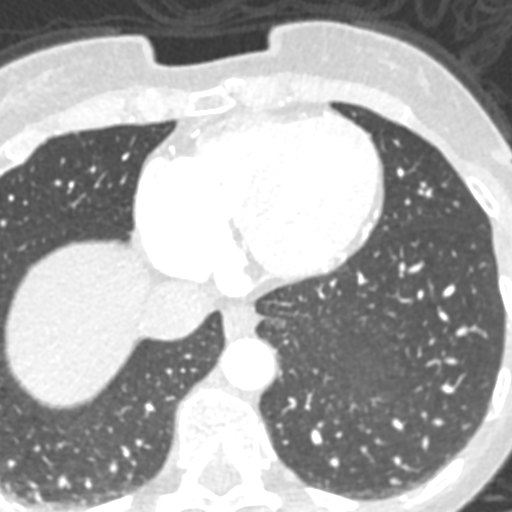
[im 204/306  vessel]
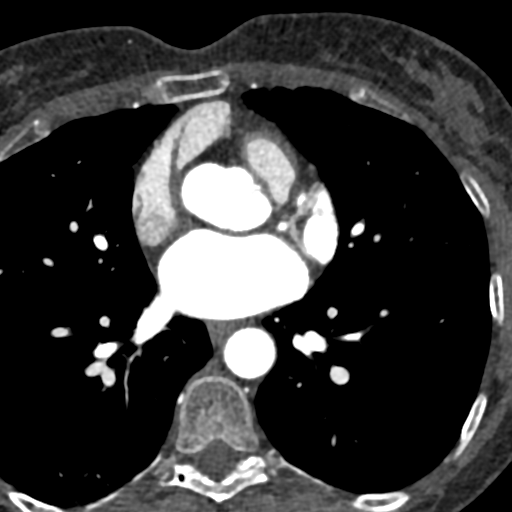

[Series 9: best diast · axial · 0.39mm/px · z∈[+1288,+1329]mm · 2 of 306 slices shown]
[im 102/306  vessel]
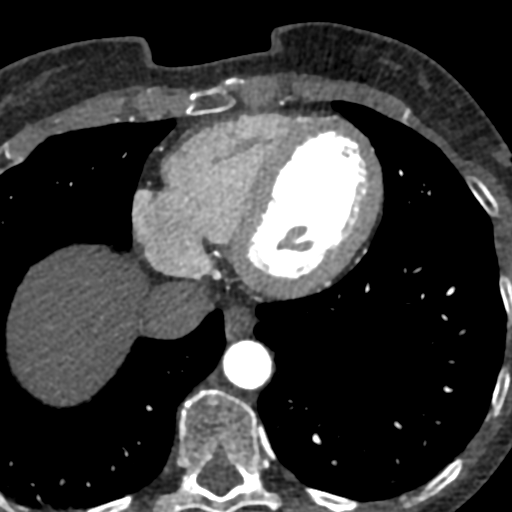
[im 204/306  vessel]
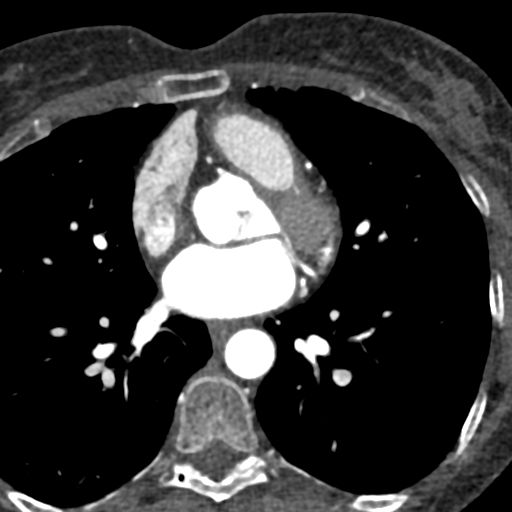

[8 of 20 positions shown; findings below may reference images not displayed]

FINDINGS: Within the visualized portions of the thorax there are no suspicious
appearing pulmonary nodules or masses, there is no acute
consolidative airspace disease, no pleural effusions, no
pneumothorax and no lymphadenopathy. Visualized portions of the
upper abdomen are unremarkable. There are no aggressive appearing
lytic or blastic lesions noted in the visualized portions of the
skeleton.
IMPRESSION: 1. No significant incidental noncardiac findings are noted.
FINDINGS: A 100 kV prospective scan was triggered in the descending thoracic
aorta at 111 HU's. Axial non-contrast 3 mm slices were carried out
through the heart. The data set was analyzed on a dedicated work
station and scored using the Agatson method. Gantry rotation speed
was 250 msecs and collimation was .6 mm. No beta blockade and 0.8 mg
of sl NTG was given. The 3D data set was reconstructed in 5%
intervals of the 67-82 % of the R-R cycle. Diastolic phases were
analyzed on a dedicated work station using MPR, MIP and VRT modes.
The patient received 80 cc of contrast.

Aorta:  Normal size.  No calcifications.  No dissection.

Aortic Valve:  Trileaflet.  No calcifications.

Coronary Arteries:  Normal coronary origin.  Left dominance.

RCA is a small non - dominant artery.  There is no plaque.

Left main is a large, short artery that gives rise to LAD and LCX
arteries.

LAD is a large vessel that has small calcified plaques in its
proximal portion with 0-25% stenosis. This artery gives rise to
small D1 and large D2.

LCX is a large, dominant artery that gives rise to one large OM1
branch. In the proximal portion of the OM1 branch there is minimal
narrowing of 0-25%.

Other findings:

Normal pulmonary vein drainage into the left atrium.

Normal left atrial appendage is large without a thrombus.

Normal size of the pulmonary artery.
IMPRESSION: 1. Coronary calcium score of 10.5 - LAD. This was 35 percentile for
age and sex matched control.

2. Normal coronary origin with left dominance.

3. CAD-RADS 1. Minimal non-obstructive CAD (0-24%). Consider
non-atherosclerotic causes of chest pain. Consider preventive
therapy and risk factor modification.

*** End of Addendum ***
EXAM:
OVER-READ INTERPRETATION  CT CHEST

The following report is an over-read performed by radiologist Dr.
Shalley Jim [REDACTED] on 05/06/2021. This
over-read does not include interpretation of cardiac or coronary
anatomy or pathology. The coronary calcium score/coronary CTA
interpretation by the cardiologist is attached.
FINDINGS: Within the visualized portions of the thorax there are no suspicious
appearing pulmonary nodules or masses, there is no acute
consolidative airspace disease, no pleural effusions, no
pneumothorax and no lymphadenopathy. Visualized portions of the
upper abdomen are unremarkable. There are no aggressive appearing
lytic or blastic lesions noted in the visualized portions of the
skeleton.
IMPRESSION: 1. No significant incidental noncardiac findings are noted.

## 2023-12-20 ENCOUNTER — Encounter: Payer: Self-pay | Admitting: Radiology
